# Patient Record
Sex: Female | Born: 1955 | Race: White | Hispanic: No | Marital: Married | Smoking: Never smoker
Health system: Southern US, Community
[De-identification: ages and names within clinical notes are randomized; demographics above are authoritative.]

## PROBLEM LIST (undated history)

## (undated) DIAGNOSIS — K219 Gastro-esophageal reflux disease without esophagitis: Secondary | ICD-10-CM

## (undated) DIAGNOSIS — E7800 Pure hypercholesterolemia, unspecified: Secondary | ICD-10-CM

## (undated) DIAGNOSIS — R7303 Prediabetes: Secondary | ICD-10-CM

## (undated) HISTORY — PX: ORIF ANKLE FRACTURE: SUR919

## (undated) HISTORY — PX: CARPAL TUNNEL RELEASE: SHX101

## (undated) HISTORY — PX: CHOLECYSTECTOMY: SHX55

---

## 2000-12-28 ENCOUNTER — Encounter: Payer: Self-pay | Admitting: Emergency Medicine

## 2000-12-28 ENCOUNTER — Emergency Department (HOSPITAL_COMMUNITY): Admission: EM | Admit: 2000-12-28 | Discharge: 2000-12-28 | Payer: Self-pay | Admitting: Emergency Medicine

## 2005-01-29 ENCOUNTER — Other Ambulatory Visit: Admission: RE | Admit: 2005-01-29 | Discharge: 2005-01-29 | Payer: Self-pay | Admitting: Family Medicine

## 2008-05-10 ENCOUNTER — Encounter: Admission: RE | Admit: 2008-05-10 | Discharge: 2008-05-10 | Payer: Self-pay | Admitting: Family Medicine

## 2012-07-07 ENCOUNTER — Other Ambulatory Visit (HOSPITAL_COMMUNITY)
Admission: RE | Admit: 2012-07-07 | Discharge: 2012-07-07 | Disposition: A | Discharge status: Home / Self Care | Payer: Self-pay | Source: Ambulatory Visit | Attending: Family Medicine | Admitting: Family Medicine

## 2012-07-07 ENCOUNTER — Other Ambulatory Visit: Payer: Self-pay | Admitting: Physician Assistant

## 2012-07-07 DIAGNOSIS — Z0000 Encounter for general adult medical examination without abnormal findings: Secondary | ICD-10-CM | POA: Insufficient documentation

## 2013-07-10 ENCOUNTER — Other Ambulatory Visit: Payer: Self-pay | Admitting: Family Medicine

## 2013-07-10 DIAGNOSIS — R921 Mammographic calcification found on diagnostic imaging of breast: Secondary | ICD-10-CM

## 2013-07-18 ENCOUNTER — Ambulatory Visit
Admission: RE | Admit: 2013-07-18 | Discharge: 2013-07-18 | Disposition: A | Discharge status: Home / Self Care | Payer: BC Managed Care – PPO | Source: Ambulatory Visit | Attending: Family Medicine | Admitting: Family Medicine

## 2013-07-18 ENCOUNTER — Encounter (INDEPENDENT_AMBULATORY_CARE_PROVIDER_SITE_OTHER): Payer: Self-pay

## 2013-07-18 ENCOUNTER — Other Ambulatory Visit: Payer: Self-pay | Admitting: Family Medicine

## 2013-07-18 DIAGNOSIS — R921 Mammographic calcification found on diagnostic imaging of breast: Secondary | ICD-10-CM

## 2013-09-10 ENCOUNTER — Other Ambulatory Visit (HOSPITAL_COMMUNITY): Payer: Self-pay | Admitting: Family Medicine

## 2013-09-10 DIAGNOSIS — R011 Cardiac murmur, unspecified: Secondary | ICD-10-CM

## 2013-09-12 ENCOUNTER — Ambulatory Visit (HOSPITAL_COMMUNITY)
Admission: RE | Admit: 2013-09-12 | Discharge: 2013-09-12 | Disposition: A | Discharge status: Home / Self Care | Payer: BC Managed Care – PPO | Source: Ambulatory Visit | Attending: Cardiovascular Disease | Admitting: Cardiovascular Disease

## 2013-09-12 DIAGNOSIS — R011 Cardiac murmur, unspecified: Secondary | ICD-10-CM | POA: Insufficient documentation

## 2013-09-12 DIAGNOSIS — I369 Nonrheumatic tricuspid valve disorder, unspecified: Secondary | ICD-10-CM

## 2013-09-12 NOTE — Progress Notes (Signed)
2D Echo Performed 09/12/2013    Jaquari Reckner, RCS  

## 2013-12-24 ENCOUNTER — Other Ambulatory Visit: Payer: Self-pay | Admitting: Family Medicine

## 2013-12-24 DIAGNOSIS — R921 Mammographic calcification found on diagnostic imaging of breast: Secondary | ICD-10-CM

## 2014-01-22 ENCOUNTER — Ambulatory Visit
Admission: RE | Admit: 2014-01-22 | Discharge: 2014-01-22 | Disposition: A | Discharge status: Home / Self Care | Payer: BC Managed Care – PPO | Source: Ambulatory Visit | Attending: Family Medicine | Admitting: Family Medicine

## 2014-01-22 DIAGNOSIS — R921 Mammographic calcification found on diagnostic imaging of breast: Secondary | ICD-10-CM

## 2014-05-20 ENCOUNTER — Other Ambulatory Visit: Payer: Self-pay | Admitting: Family Medicine

## 2014-05-20 DIAGNOSIS — R921 Mammographic calcification found on diagnostic imaging of breast: Secondary | ICD-10-CM

## 2014-06-07 ENCOUNTER — Ambulatory Visit
Admission: RE | Admit: 2014-06-07 | Discharge: 2014-06-07 | Disposition: A | Discharge status: Home / Self Care | Payer: BLUE CROSS/BLUE SHIELD | Source: Ambulatory Visit | Attending: Family Medicine | Admitting: Family Medicine

## 2014-06-07 DIAGNOSIS — R921 Mammographic calcification found on diagnostic imaging of breast: Secondary | ICD-10-CM

## 2015-06-13 ENCOUNTER — Other Ambulatory Visit: Payer: Self-pay | Admitting: Family Medicine

## 2015-06-13 DIAGNOSIS — R921 Mammographic calcification found on diagnostic imaging of breast: Secondary | ICD-10-CM

## 2015-06-14 DIAGNOSIS — Z0100 Encounter for examination of eyes and vision without abnormal findings: Secondary | ICD-10-CM | POA: Diagnosis not present

## 2015-06-14 DIAGNOSIS — H524 Presbyopia: Secondary | ICD-10-CM | POA: Diagnosis not present

## 2015-06-18 ENCOUNTER — Ambulatory Visit
Admission: RE | Admit: 2015-06-18 | Discharge: 2015-06-18 | Disposition: A | Discharge status: Home / Self Care | Payer: BLUE CROSS/BLUE SHIELD | Source: Ambulatory Visit | Attending: Family Medicine | Admitting: Family Medicine

## 2015-06-18 DIAGNOSIS — R921 Mammographic calcification found on diagnostic imaging of breast: Secondary | ICD-10-CM | POA: Diagnosis not present

## 2015-07-09 DIAGNOSIS — R42 Dizziness and giddiness: Secondary | ICD-10-CM | POA: Diagnosis not present

## 2015-12-03 ENCOUNTER — Other Ambulatory Visit (HOSPITAL_COMMUNITY)
Admission: RE | Admit: 2015-12-03 | Discharge: 2015-12-03 | Disposition: A | Discharge status: Home / Self Care | Payer: BLUE CROSS/BLUE SHIELD | Source: Ambulatory Visit | Attending: Family Medicine | Admitting: Family Medicine

## 2015-12-03 ENCOUNTER — Other Ambulatory Visit: Payer: Self-pay | Admitting: Family Medicine

## 2015-12-03 DIAGNOSIS — Z01419 Encounter for gynecological examination (general) (routine) without abnormal findings: Secondary | ICD-10-CM | POA: Insufficient documentation

## 2015-12-03 DIAGNOSIS — Z124 Encounter for screening for malignant neoplasm of cervix: Secondary | ICD-10-CM | POA: Diagnosis not present

## 2015-12-03 DIAGNOSIS — E119 Type 2 diabetes mellitus without complications: Secondary | ICD-10-CM | POA: Diagnosis not present

## 2015-12-03 DIAGNOSIS — Z0000 Encounter for general adult medical examination without abnormal findings: Secondary | ICD-10-CM | POA: Diagnosis not present

## 2015-12-03 DIAGNOSIS — Z6834 Body mass index (BMI) 34.0-34.9, adult: Secondary | ICD-10-CM | POA: Diagnosis not present

## 2015-12-03 DIAGNOSIS — E785 Hyperlipidemia, unspecified: Secondary | ICD-10-CM | POA: Diagnosis not present

## 2015-12-04 LAB — CYTOLOGY - PAP: DIAGNOSIS: NEGATIVE

## 2016-07-22 ENCOUNTER — Other Ambulatory Visit: Payer: Self-pay | Admitting: Family Medicine

## 2016-07-22 DIAGNOSIS — Z1231 Encounter for screening mammogram for malignant neoplasm of breast: Secondary | ICD-10-CM

## 2016-08-02 ENCOUNTER — Ambulatory Visit
Admission: RE | Admit: 2016-08-02 | Discharge: 2016-08-02 | Disposition: A | Discharge status: Home / Self Care | Payer: BLUE CROSS/BLUE SHIELD | Source: Ambulatory Visit | Attending: Family Medicine | Admitting: Family Medicine

## 2016-08-02 DIAGNOSIS — Z1231 Encounter for screening mammogram for malignant neoplasm of breast: Secondary | ICD-10-CM

## 2016-08-15 ENCOUNTER — Encounter (HOSPITAL_COMMUNITY): Payer: Self-pay | Admitting: *Deleted

## 2016-08-15 ENCOUNTER — Ambulatory Visit (HOSPITAL_COMMUNITY)
Admission: EM | Admit: 2016-08-15 | Discharge: 2016-08-15 | Disposition: A | Discharge status: Home / Self Care | Payer: BLUE CROSS/BLUE SHIELD | Attending: Internal Medicine | Admitting: Internal Medicine

## 2016-08-15 DIAGNOSIS — S39012A Strain of muscle, fascia and tendon of lower back, initial encounter: Secondary | ICD-10-CM | POA: Diagnosis not present

## 2016-08-15 HISTORY — DX: Pure hypercholesterolemia, unspecified: E78.00

## 2016-08-15 LAB — POCT URINALYSIS DIP (DEVICE)
Bilirubin Urine: NEGATIVE
Glucose, UA: NEGATIVE mg/dL
HGB URINE DIPSTICK: NEGATIVE
Ketones, ur: NEGATIVE mg/dL
Nitrite: NEGATIVE
Protein, ur: NEGATIVE mg/dL
SPECIFIC GRAVITY, URINE: 1.025 (ref 1.005–1.030)
UROBILINOGEN UA: 0.2 mg/dL (ref 0.0–1.0)
pH: 5.5 (ref 5.0–8.0)

## 2016-08-15 MED ORDER — CYCLOBENZAPRINE HCL 10 MG PO TABS: 10.0000 mg | ORAL_TABLET | Freq: Two times a day (BID) | ORAL | 0 refills | Status: DC | PRN

## 2016-08-15 NOTE — ED Triage Notes (Signed)
C/O low back pain since last week; c/o urinary urgency with some incontinence.  Has been taking AZO & cranberry juice.  Denies dysuria.

## 2016-12-13 DIAGNOSIS — E119 Type 2 diabetes mellitus without complications: Secondary | ICD-10-CM | POA: Diagnosis not present

## 2016-12-13 DIAGNOSIS — E785 Hyperlipidemia, unspecified: Secondary | ICD-10-CM | POA: Diagnosis not present

## 2016-12-13 DIAGNOSIS — Z23 Encounter for immunization: Secondary | ICD-10-CM | POA: Diagnosis not present

## 2016-12-13 DIAGNOSIS — Z0000 Encounter for general adult medical examination without abnormal findings: Secondary | ICD-10-CM | POA: Diagnosis not present

## 2017-01-12 DIAGNOSIS — R0989 Other specified symptoms and signs involving the circulatory and respiratory systems: Secondary | ICD-10-CM | POA: Diagnosis not present

## 2017-02-02 DIAGNOSIS — M722 Plantar fascial fibromatosis: Secondary | ICD-10-CM | POA: Diagnosis not present

## 2017-02-02 DIAGNOSIS — I10 Essential (primary) hypertension: Secondary | ICD-10-CM | POA: Diagnosis not present

## 2017-02-18 DIAGNOSIS — R509 Fever, unspecified: Secondary | ICD-10-CM | POA: Diagnosis not present

## 2017-02-18 DIAGNOSIS — J111 Influenza due to unidentified influenza virus with other respiratory manifestations: Secondary | ICD-10-CM | POA: Diagnosis not present

## 2017-06-14 DIAGNOSIS — E1169 Type 2 diabetes mellitus with other specified complication: Secondary | ICD-10-CM | POA: Diagnosis not present

## 2017-06-14 DIAGNOSIS — R030 Elevated blood-pressure reading, without diagnosis of hypertension: Secondary | ICD-10-CM | POA: Diagnosis not present

## 2017-06-14 DIAGNOSIS — E785 Hyperlipidemia, unspecified: Secondary | ICD-10-CM | POA: Diagnosis not present

## 2017-06-14 DIAGNOSIS — K219 Gastro-esophageal reflux disease without esophagitis: Secondary | ICD-10-CM | POA: Diagnosis not present

## 2017-09-08 ENCOUNTER — Other Ambulatory Visit: Payer: Self-pay | Admitting: Family Medicine

## 2017-09-08 DIAGNOSIS — Z1231 Encounter for screening mammogram for malignant neoplasm of breast: Secondary | ICD-10-CM

## 2017-09-16 DIAGNOSIS — M25552 Pain in left hip: Secondary | ICD-10-CM | POA: Diagnosis not present

## 2017-09-16 DIAGNOSIS — I10 Essential (primary) hypertension: Secondary | ICD-10-CM | POA: Diagnosis not present

## 2017-10-07 ENCOUNTER — Ambulatory Visit
Admission: RE | Admit: 2017-10-07 | Discharge: 2017-10-07 | Disposition: A | Discharge status: Home / Self Care | Payer: BLUE CROSS/BLUE SHIELD | Source: Ambulatory Visit | Attending: Family Medicine | Admitting: Family Medicine

## 2017-10-07 DIAGNOSIS — Z1231 Encounter for screening mammogram for malignant neoplasm of breast: Secondary | ICD-10-CM

## 2017-11-25 DIAGNOSIS — R399 Unspecified symptoms and signs involving the genitourinary system: Secondary | ICD-10-CM | POA: Diagnosis not present

## 2017-12-28 DIAGNOSIS — E119 Type 2 diabetes mellitus without complications: Secondary | ICD-10-CM | POA: Diagnosis not present

## 2017-12-28 DIAGNOSIS — E559 Vitamin D deficiency, unspecified: Secondary | ICD-10-CM | POA: Diagnosis not present

## 2017-12-28 DIAGNOSIS — E785 Hyperlipidemia, unspecified: Secondary | ICD-10-CM | POA: Diagnosis not present

## 2017-12-28 DIAGNOSIS — Z0000 Encounter for general adult medical examination without abnormal findings: Secondary | ICD-10-CM | POA: Diagnosis not present

## 2018-08-31 ENCOUNTER — Other Ambulatory Visit: Payer: Self-pay | Admitting: Family Medicine

## 2018-08-31 DIAGNOSIS — Z1231 Encounter for screening mammogram for malignant neoplasm of breast: Secondary | ICD-10-CM

## 2018-10-13 ENCOUNTER — Other Ambulatory Visit: Payer: Self-pay

## 2018-10-13 ENCOUNTER — Ambulatory Visit
Admission: RE | Admit: 2018-10-13 | Discharge: 2018-10-13 | Disposition: A | Discharge status: Home / Self Care | Payer: Medicare Other | Source: Ambulatory Visit | Attending: Family Medicine | Admitting: Family Medicine

## 2018-10-13 DIAGNOSIS — Z1231 Encounter for screening mammogram for malignant neoplasm of breast: Secondary | ICD-10-CM

## 2019-07-18 ENCOUNTER — Ambulatory Visit: Payer: Self-pay | Admitting: General Surgery

## 2019-07-18 NOTE — H&P (Signed)
[facility] Appointment: 07/18/2019 10:20 AM Location: [facility] Patient #: [id number] DOB: [birth year] Married / Language: [place] / Race: White Female   History of Present Illness Patricia Josephs E. Patricia Morn MD; 07/18/2019 10:36 AM) The patient is a 64 year old female who presents with an abdominal wall hernia. Dr. Merri Brunette asked me to see Patricia Beltran in consultation regarding a ventral hernia. Patricia Beltran works at a [employer] and felt some discomfort in her epigastric region. She noted a small lump there. Dr. Katrinka Blazing ordered an ultrasound for further evaluation and this showed a small fat-containing hernia. Patricia Beltran has had a laparoscopic cholecystectomy but no other abdominal surgeries in the past. She C/O localized pain which is exacerbated by activity including work. No change in bowel habits or bladder habits.   Allergies [facility], RMA; 07/18/2019 10:15 AM) No Known Drug Allergies  [07/18/2019]: Allergies Reconciled   Medication History [facility], RMA; 07/18/2019 10:15 AM) Simvastatin (40MG  Tablet, Oral) Active. Aspirin (81MG  Tablet DR, Oral) Active. Medications Reconciled  Vitals (Patricia Beltran RMA; 07/18/2019 10:16 AM) 07/18/2019 10:15 AM Weight: 250 lb Height: 61in Body Surface Area: 2.08 m Body Mass Index: 47.24 kg/m  Temp.: 97.9F (Temporal)  Pulse: 86 (Regular)  P.OX: 97% (Room air) BP: 140/88(Sitting, Left Wrist, Standard)       Physical Exam 07/20/2019 E. 07/20/2019 MD; 07/18/2019 10:38 AM) General Mental Status-Alert. General Appearance-Consistent with stated age. Hydration-Well hydrated. Voice-Normal.  Head and Neck Head-normocephalic, atraumatic with no lesions or palpable masses. Trachea-midline. Thyroid Gland Characteristics - normal size and consistency.  Eye Eyeball - Bilateral-Extraocular movements intact. Sclera/Conjunctiva - Bilateral-No scleral icterus.  Chest and Lung Exam Chest and  lung exam reveals -quiet, even and easy respiratory effort with no use of accessory muscles and on auscultation, normal breath sounds, no adventitious sounds and normal vocal resonance. Inspection Chest Wall - Normal. Back - normal.  Cardiovascular Cardiovascular examination reveals -normal heart sounds, regular rate and rhythm with no murmurs and normal pedal pulses bilaterally.  Abdomen Inspection Hernias - Ventral - Reducible. Note: Small ventral hernia about 8cm down from 20 her xiphoid, easily reduces but does spontaneously recur when she stands up, NT. Incisional scars - Note: lap chole scars very faint and hard to see with abdominal striae. Palpation/Percussion Palpation and Percussion of the abdomen reveal - Soft, Non Tender, No Rebound tenderness, No Rigidity (guarding) and No hepatosplenomegaly. Auscultation Auscultation of the abdomen reveals - Bowel sounds normal.  Neurologic Neurologic evaluation reveals -alert and oriented x 3 with no impairment of recent or remote memory. Mental Status-Normal.  Musculoskeletal Global Assessment -Note: no gross deformities.  Normal Exam - Left-Upper Extremity Strength Normal and Lower Extremity Strength Normal. Normal Exam - Right-Upper Extremity Strength Normal and Lower Extremity Strength Normal.  Lymphatic Head & Neck  General Head & Neck Lymphatics: Bilateral - Description - Normal. Axillary  General Axillary Region: Bilateral - Description - Normal. Tenderness - Non Tender. Femoral & Inguinal  Generalized Femoral & Inguinal Lymphatics: Bilateral - Description - No Generalized lymphadenopathy.    Assessment & Plan Patricia Beltran E. 07/20/2019 MD; 07/18/2019 10:40 AM) VENTRAL HERNIA WITHOUT OBSTRUCTION OR GANGRENE (K43.9) Impression: Hernia defect feels small, probably 2 cm. I have offered ventral hernia repair with mesh as an outpatient surgical procedure. I discussed the procedure, risks, benefits, and expected  postoperative course. She will not be able to lift anything heavy and will need to be off work for 6 weeks postoperatively. She does have short-term disability from her employer. I also discussed  the ERAS protocol and answered her questions. She is agreeable.  Violeta Gelinas, MD, MPH, FACS Please use [email or web address] to contact on call provider

## 2019-08-16 NOTE — Pre-Procedure Instructions (Signed)
Your procedure is scheduled on Tuesday, August 3rd, from 09:00 AM- 10:00 AM.  Report to [facility] Main Entrance "A" at 07:00 A.M., and check in at the Admitting office.  Call this number if you have problems the morning of surgery:  [phone]  Call [phone] if you have any questions prior to your surgery date Monday-Friday 8am-4pm.    Remember:  Do not eat after midnight the night before your surgery.  You may drink clear liquids until 06:00 AM the morning of your surgery.   Clear liquids allowed are: Water, Non-Citrus Juices (without pulp), Carbonated Beverages, Clear Tea, Black Coffee Only, and Gatorade. .  . The day of surgery (if you do NOT have diabetes):  o Drink ONE (1) Pre-Surgery Clear Ensure by 06:00 AM the morning of surgery.   o This drink was given to you during your hospital  pre-op appointment visit. o Nothing else to drink after completing the  Pre-Surgery Clear Ensure.         If you have questions, please contact your surgeon's office.     Take these medicines the morning of surgery with A SIP OF WATER: simvastatin (ZOCOR)  As of today, STOP taking any Aspirin (unless otherwise instructed by your surgeon) Aleve, Naproxen, Ibuprofen, Motrin, Advil, Goody's, BC's, all herbal medications, fish oil, and all vitamins.         The Morning of Surgery:             Do not wear jewelry, make up, or nail polish.            Do not wear lotions, powders, perfumes, or deodorant.            Do not shave 48 hours prior to surgery.              Do not bring valuables to the hospital.            [facility] is not responsible for any belongings or valuables.  Do NOT Smoke (Tobacco/Vaping) or drink Alcohol 24 hours prior to your procedure. If you use a CPAP at night, you may bring all equipment for your overnight stay.   Contacts, glasses, dentures or bridgework may not be worn into surgery.      For patients admitted to the hospital, discharge time will be  determined by your treatment team.   Patients discharged the day of surgery will not be allowed to drive home, and someone needs to stay with them for 24 hours.    Special instructions:   [place]- Preparing For Surgery  Before surgery, you can play an important role. Because skin is not sterile, your skin needs to be as free of germs as possible. You can reduce the number of germs on your skin by washing with CHG (chlorahexidine gluconate) Soap before surgery.  CHG is an antiseptic cleaner which kills germs and bonds with the skin to continue killing germs even after washing.    Oral Hygiene is also important to reduce your risk of infection.  Remember - BRUSH YOUR TEETH THE MORNING OF SURGERY WITH YOUR REGULAR TOOTHPASTE  Please do not use if you have an allergy to CHG or antibacterial soaps. If your skin becomes reddened/irritated stop using the CHG.  Do not shave (including legs and underarms) for at least 48 hours prior to first CHG shower. It is OK to shave your face.  Please follow these instructions carefully.   1. Shower the NIGHT BEFORE SURGERY and  the MORNING OF SURGERY with CHG Soap.   2. If you chose to wash your hair, wash your hair first as usual with your normal shampoo.  3. After you shampoo, rinse your hair and body thoroughly to remove the shampoo.  4. Use CHG as you would any other liquid soap. You can apply CHG directly to the skin and wash gently with a scrungie or a clean washcloth.   5. Apply the CHG Soap to your body ONLY FROM THE NECK DOWN.  Do not use on open wounds or open sores. Avoid contact with your eyes, ears, mouth and genitals (private parts). Wash Face and genitals (private parts)  with your normal soap.   6. Wash thoroughly, paying special attention to the area where your surgery will be performed.  7. Thoroughly rinse your body with warm water from the neck down.  8. DO NOT shower/wash with your normal soap after using and rinsing off the CHG  Soap.  9. Pat yourself dry with a CLEAN TOWEL.  10. Wear CLEAN PAJAMAS to bed the night before surgery  11. Place CLEAN SHEETS on your bed the night of your first shower and DO NOT SLEEP WITH PETS.   Day of Surgery: Wear Clean/Comfortable clothing the morning of surgery Do not apply any deodorants/lotions.   Remember to brush your teeth WITH YOUR REGULAR TOOTHPASTE.   Please read over the following fact sheets that you were given.

## 2019-08-17 ENCOUNTER — Encounter (HOSPITAL_COMMUNITY): Payer: Self-pay

## 2019-08-17 ENCOUNTER — Other Ambulatory Visit: Payer: Self-pay

## 2019-08-17 ENCOUNTER — Other Ambulatory Visit (HOSPITAL_COMMUNITY)
Admission: RE | Admit: 2019-08-17 | Discharge: 2019-08-17 | Disposition: A | Discharge status: Home / Self Care | Payer: Medicare Other | Source: Ambulatory Visit | Attending: General Surgery | Admitting: General Surgery

## 2019-08-17 ENCOUNTER — Encounter (HOSPITAL_COMMUNITY)
Admission: RE | Admit: 2019-08-17 | Discharge: 2019-08-17 | Disposition: A | Discharge status: Home / Self Care | Payer: Medicare Other | Source: Ambulatory Visit | Attending: General Surgery | Admitting: General Surgery

## 2019-08-17 DIAGNOSIS — Z01812 Encounter for preprocedural laboratory examination: Secondary | ICD-10-CM | POA: Insufficient documentation

## 2019-08-17 DIAGNOSIS — Z20822 Contact with and (suspected) exposure to covid-19: Secondary | ICD-10-CM | POA: Insufficient documentation

## 2019-08-17 HISTORY — DX: Gastro-esophageal reflux disease without esophagitis: K21.9

## 2019-08-17 HISTORY — DX: Prediabetes: R73.03

## 2019-08-17 LAB — CBC
HCT: 42.3 % (ref 36.0–46.0)
Hemoglobin: 13.5 g/dL (ref 12.0–15.0)
MCH: 29.3 pg (ref 26.0–34.0)
MCHC: 31.9 g/dL (ref 30.0–36.0)
MCV: 91.8 fL (ref 80.0–100.0)
Platelets: 266 10*3/uL (ref 150–400)
RBC: 4.61 MIL/uL (ref 3.87–5.11)
RDW: 12.4 % (ref 11.5–15.5)
WBC: 5.9 10*3/uL (ref 4.0–10.5)
nRBC: 0 % (ref 0.0–0.2)

## 2019-08-17 LAB — HEMOGLOBIN A1C
Hgb A1c MFr Bld: 6.3 % — ABNORMAL HIGH (ref 4.8–5.6)
Mean Plasma Glucose: 134.11 mg/dL

## 2019-08-17 LAB — SARS CORONAVIRUS 2 (TAT 6-24 HRS): SARS Coronavirus 2: NEGATIVE

## 2019-08-17 NOTE — Progress Notes (Signed)
PCP - Merri Brunette, MD Cardiologist - Denies  PPM/ICD - Denies  Chest x-ray - N/A EKG - N/A Stress Test - Denies ECHO - Denies Cardiac Cath - Denies  Sleep Study - Denies  Patient states she was told she was pre-diabetic years ago. Does not check CBG. A1C  obtained.  Blood Thinner Instructions: N/A Aspirin Instructions: N/A  ERAS Protcol - Yes PRE-SURGERY Ensure or G2- Ensure given  COVID TEST- 08/17/19   Anesthesia review: No  Patient denies shortness of breath, fever, cough and chest pain at PAT appointment   All instructions explained to the patient, with a verbal understanding of the material. Patient agrees to go over the instructions while at home for a better understanding. Patient also instructed to self quarantine after being tested for COVID-19. The opportunity to ask questions was provided.

## 2019-08-21 ENCOUNTER — Ambulatory Visit (HOSPITAL_COMMUNITY): Payer: Medicare Other

## 2019-08-21 ENCOUNTER — Encounter (HOSPITAL_COMMUNITY): Payer: Self-pay | Admitting: General Surgery

## 2019-08-21 ENCOUNTER — Encounter (HOSPITAL_COMMUNITY)
Admission: RE | Disposition: A | Discharge status: Home / Self Care | Payer: Self-pay | Source: Home / Self Care | Attending: General Surgery

## 2019-08-21 ENCOUNTER — Ambulatory Visit (HOSPITAL_COMMUNITY)
Admission: RE | Admit: 2019-08-21 | Discharge: 2019-08-21 | Disposition: A | Discharge status: Home / Self Care | Payer: Medicare Other | Attending: General Surgery | Admitting: General Surgery

## 2019-08-21 ENCOUNTER — Other Ambulatory Visit: Payer: Self-pay

## 2019-08-21 DIAGNOSIS — K439 Ventral hernia without obstruction or gangrene: Secondary | ICD-10-CM | POA: Diagnosis present

## 2019-08-21 DIAGNOSIS — E7800 Pure hypercholesterolemia, unspecified: Secondary | ICD-10-CM | POA: Diagnosis not present

## 2019-08-21 DIAGNOSIS — Z9049 Acquired absence of other specified parts of digestive tract: Secondary | ICD-10-CM | POA: Diagnosis not present

## 2019-08-21 DIAGNOSIS — Z7982 Long term (current) use of aspirin: Secondary | ICD-10-CM | POA: Diagnosis not present

## 2019-08-21 DIAGNOSIS — Z79899 Other long term (current) drug therapy: Secondary | ICD-10-CM | POA: Diagnosis not present

## 2019-08-21 HISTORY — PX: VENTRAL HERNIA REPAIR: SHX424

## 2019-08-21 LAB — GLUCOSE, CAPILLARY
Glucose-Capillary: 194 mg/dL — ABNORMAL HIGH (ref 70–99)
Glucose-Capillary: 194 mg/dL — ABNORMAL HIGH (ref 70–99)
Glucose-Capillary: 112 mg/dL — ABNORMAL HIGH (ref 70–99)

## 2019-08-21 SURGERY — REPAIR, HERNIA, VENTRAL
Anesthesia: General | Site: Abdomen

## 2019-08-21 MED ORDER — BUPIVACAINE-EPINEPHRINE (PF) 0.25% -1:200000 IJ SOLN
INTRAMUSCULAR | Status: AC
  Filled 2019-08-21: qty 30

## 2019-08-21 MED ORDER — PROPOFOL 10 MG/ML IV BOLUS
INTRAVENOUS | Status: DC | PRN
  Administered 2019-08-21: 140 mg via INTRAVENOUS

## 2019-08-21 MED ORDER — STERILE WATER FOR IRRIGATION IR SOLN
Status: DC | PRN
  Administered 2019-08-21: 1000 mL

## 2019-08-21 MED ORDER — ORAL CARE MOUTH RINSE: 15.0000 mL | Freq: Once | OROMUCOSAL | Status: AC

## 2019-08-21 MED ORDER — DEXAMETHASONE SODIUM PHOSPHATE 10 MG/ML IJ SOLN
INTRAMUSCULAR | Status: AC
  Filled 2019-08-21: qty 1

## 2019-08-21 MED ORDER — OXYCODONE HCL 5 MG PO TABS
5.0000 mg | ORAL_TABLET | Freq: Four times a day (QID) | ORAL | 0 refills | Status: AC | PRN
Start: 2019-08-21 — End: ?

## 2019-08-21 MED ORDER — ROCURONIUM BROMIDE 10 MG/ML (PF) SYRINGE
PREFILLED_SYRINGE | INTRAVENOUS | Status: AC
  Filled 2019-08-21: qty 10

## 2019-08-21 MED ORDER — LIDOCAINE 2% (20 MG/ML) 5 ML SYRINGE
INTRAMUSCULAR | Status: AC
  Filled 2019-08-21: qty 5

## 2019-08-21 MED ORDER — PHENYLEPHRINE 40 MCG/ML (10ML) SYRINGE FOR IV PUSH (FOR BLOOD PRESSURE SUPPORT)
PREFILLED_SYRINGE | INTRAVENOUS | Status: AC
  Filled 2019-08-21: qty 20

## 2019-08-21 MED ORDER — CHLORHEXIDINE GLUCONATE 0.12 % MT SOLN
15.0000 mL | Freq: Once | OROMUCOSAL | Status: AC
  Administered 2019-08-21: 15 mL via OROMUCOSAL
  Filled 2019-08-21: qty 15

## 2019-08-21 MED ORDER — SUGAMMADEX SODIUM 200 MG/2ML IV SOLN
INTRAVENOUS | Status: DC | PRN
Start: 2019-08-21 — End: 2019-08-21
  Administered 2019-08-21: 400 mg via INTRAVENOUS

## 2019-08-21 MED ORDER — MIDAZOLAM HCL 2 MG/2ML IJ SOLN
INTRAMUSCULAR | Status: AC
  Filled 2019-08-21: qty 2

## 2019-08-21 MED ORDER — BUPIVACAINE-EPINEPHRINE 0.25% -1:200000 IJ SOLN
INTRAMUSCULAR | Status: DC | PRN
  Administered 2019-08-21: 17 mL

## 2019-08-21 MED ORDER — ONDANSETRON HCL 4 MG/2ML IJ SOLN: 4.0000 mg | Freq: Once | INTRAMUSCULAR | Status: DC | PRN

## 2019-08-21 MED ORDER — FENTANYL CITRATE (PF) 100 MCG/2ML IJ SOLN: 25.0000 ug | INTRAMUSCULAR | Status: DC | PRN

## 2019-08-21 MED ORDER — CHLORHEXIDINE GLUCONATE CLOTH 2 % EX PADS: 6.0000 | MEDICATED_PAD | Freq: Once | CUTANEOUS | Status: DC

## 2019-08-21 MED ORDER — ROCURONIUM BROMIDE 100 MG/10ML IV SOLN
INTRAVENOUS | Status: DC | PRN
  Administered 2019-08-21: 60 mg via INTRAVENOUS

## 2019-08-21 MED ORDER — MIDAZOLAM HCL 5 MG/5ML IJ SOLN
INTRAMUSCULAR | Status: DC | PRN
  Administered 2019-08-21: 2 mg via INTRAVENOUS

## 2019-08-21 MED ORDER — PHENYLEPHRINE HCL (PRESSORS) 10 MG/ML IV SOLN
INTRAVENOUS | Status: DC | PRN
  Administered 2019-08-21 (×2): 80 ug via INTRAVENOUS
  Administered 2019-08-21: 120 ug via INTRAVENOUS

## 2019-08-21 MED ORDER — LACTATED RINGERS IV SOLN: INTRAVENOUS | Status: DC

## 2019-08-21 MED ORDER — ENSURE PRE-SURGERY PO LIQD: 296.0000 mL | Freq: Once | ORAL | Status: DC

## 2019-08-21 MED ORDER — ONDANSETRON HCL 4 MG/2ML IJ SOLN
INTRAMUSCULAR | Status: AC
  Filled 2019-08-21: qty 2

## 2019-08-21 MED ORDER — FENTANYL CITRATE (PF) 250 MCG/5ML IJ SOLN
INTRAMUSCULAR | Status: DC | PRN
  Administered 2019-08-21: 75 ug via INTRAVENOUS

## 2019-08-21 MED ORDER — OXYCODONE HCL 5 MG/5ML PO SOLN: 5.0000 mg | Freq: Once | ORAL | Status: DC | PRN

## 2019-08-21 MED ORDER — FENTANYL CITRATE (PF) 250 MCG/5ML IJ SOLN
INTRAMUSCULAR | Status: AC
  Filled 2019-08-21: qty 5

## 2019-08-21 MED ORDER — CEFAZOLIN SODIUM-DEXTROSE 2-4 GM/100ML-% IV SOLN
2.0000 g | INTRAVENOUS | Status: AC
  Administered 2019-08-21: 2 g via INTRAVENOUS
  Filled 2019-08-21: qty 100

## 2019-08-21 MED ORDER — ACETAMINOPHEN 500 MG PO TABS
1000.0000 mg | ORAL_TABLET | ORAL | Status: AC
  Administered 2019-08-21: 1000 mg via ORAL
  Filled 2019-08-21: qty 2

## 2019-08-21 MED ORDER — DEXAMETHASONE SODIUM PHOSPHATE 10 MG/ML IJ SOLN
INTRAMUSCULAR | Status: DC | PRN
Start: 2019-08-21 — End: 2019-08-21
  Administered 2019-08-21: 5 mg via INTRAVENOUS

## 2019-08-21 MED ORDER — GABAPENTIN 300 MG PO CAPS
300.0000 mg | ORAL_CAPSULE | ORAL | Status: AC
  Administered 2019-08-21: 300 mg via ORAL
  Filled 2019-08-21: qty 1

## 2019-08-21 MED ORDER — ONDANSETRON HCL 4 MG/2ML IJ SOLN
INTRAMUSCULAR | Status: DC | PRN
  Administered 2019-08-21: 4 mg via INTRAVENOUS

## 2019-08-21 MED ORDER — OXYCODONE HCL 5 MG PO TABS: 5.0000 mg | ORAL_TABLET | Freq: Once | ORAL | Status: DC | PRN

## 2019-08-21 MED ORDER — 0.9 % SODIUM CHLORIDE (POUR BTL) OPTIME
TOPICAL | Status: DC | PRN
  Administered 2019-08-21: 1000 mL

## 2019-08-21 MED ORDER — LIDOCAINE 2% (20 MG/ML) 5 ML SYRINGE
INTRAMUSCULAR | Status: DC | PRN
  Administered 2019-08-21: 60 mg via INTRAVENOUS

## 2019-08-21 SURGICAL SUPPLY — 41 items
ADH SKN CLS APL DERMABOND .7 (GAUZE/BANDAGES/DRESSINGS) ×2
APL PRP STRL LF DISP 70% ISPRP (MISCELLANEOUS) ×1
BLADE CLIPPER SURG (BLADE) IMPLANT
CANISTER SUCT 3000ML PPV (MISCELLANEOUS) ×2 IMPLANT
CHLORAPREP W/TINT 26 (MISCELLANEOUS) ×2 IMPLANT
COVER SURGICAL LIGHT HANDLE (MISCELLANEOUS) ×2 IMPLANT
COVER WAND RF STERILE (DRAPES) ×2 IMPLANT
DERMABOND ADVANCED (GAUZE/BANDAGES/DRESSINGS) ×2
DERMABOND ADVANCED .7 DNX12 (GAUZE/BANDAGES/DRESSINGS) IMPLANT
DRAPE LAPAROSCOPIC ABDOMINAL (DRAPES) ×2 IMPLANT
ELECT BLADE 4.0 EZ CLEAN MEGAD (MISCELLANEOUS) ×2
ELECT CAUTERY BLADE 6.4 (BLADE) ×2 IMPLANT
ELECT REM PT RETURN 9FT ADLT (ELECTROSURGICAL) ×2
ELECTRODE BLDE 4.0 EZ CLN MEGD (MISCELLANEOUS) IMPLANT
ELECTRODE REM PT RTRN 9FT ADLT (ELECTROSURGICAL) ×1 IMPLANT
GAUZE SPONGE 4X4 12PLY STRL (GAUZE/BANDAGES/DRESSINGS) IMPLANT
GLOVE BIO SURGEON STRL SZ8 (GLOVE) ×2 IMPLANT
GLOVE BIOGEL PI IND STRL 8 (GLOVE) ×1 IMPLANT
GLOVE BIOGEL PI INDICATOR 8 (GLOVE) ×1
GOWN STRL REUS W/ TWL LRG LVL3 (GOWN DISPOSABLE) ×2 IMPLANT
GOWN STRL REUS W/ TWL XL LVL3 (GOWN DISPOSABLE) ×1 IMPLANT
GOWN STRL REUS W/TWL LRG LVL3 (GOWN DISPOSABLE) ×4
GOWN STRL REUS W/TWL XL LVL3 (GOWN DISPOSABLE) ×2
KIT BASIN OR (CUSTOM PROCEDURE TRAY) ×2 IMPLANT
KIT TURNOVER KIT B (KITS) ×2 IMPLANT
MESH VENTRALEX ST 1-7/10 CRC S (Mesh General) ×1 IMPLANT
NEEDLE 22X1 1/2 (OR ONLY) (NEEDLE) ×2 IMPLANT
NS IRRIG 1000ML POUR BTL (IV SOLUTION) ×2 IMPLANT
PACK GENERAL/GYN (CUSTOM PROCEDURE TRAY) ×2 IMPLANT
PAD ARMBOARD 7.5X6 YLW CONV (MISCELLANEOUS) ×4 IMPLANT
PENCIL SMOKE EVACUATOR (MISCELLANEOUS) ×2 IMPLANT
STAPLER VISISTAT 35W (STAPLE) ×1 IMPLANT
SUT MNCRL AB 4-0 PS2 18 (SUTURE) ×2 IMPLANT
SUT PROLENE 0 CT 2 (SUTURE) ×2 IMPLANT
SUT VIC AB 3-0 SH 27 (SUTURE) ×2
SUT VIC AB 3-0 SH 27X BRD (SUTURE) ×1 IMPLANT
SYR BULB EAR ULCER 3OZ GRN STR (SYRINGE) ×1 IMPLANT
SYR CONTROL 10ML LL (SYRINGE) ×2 IMPLANT
TOWEL GREEN STERILE (TOWEL DISPOSABLE) ×2 IMPLANT
TOWEL GREEN STERILE FF (TOWEL DISPOSABLE) ×2 IMPLANT
TRAY FOLEY MTR SLVR 14FR STAT (SET/KITS/TRAYS/PACK) IMPLANT

## 2019-08-21 NOTE — Transfer of Care (Signed)
Immediate Anesthesia Transfer of Care Note  Patient: Patricia Beltran  Procedure(s) Performed: REPAIR OF VENTRAL HERNIA WITH MESH (N/A Abdomen)  Patient Location: PACU  Anesthesia Type:General  Level of Consciousness: drowsy and patient cooperative  Airway & Oxygen Therapy: Patient Spontanous Breathing and Patient connected to face mask  Post-op Assessment: Report given to RN, Post -op Vital signs reviewed and stable and Patient moving all extremities  Post vital signs: Reviewed and stable  Last Vitals:  Vitals Value Taken Time  BP 157/79    Temp    Pulse 70 08/21/19 1008  Resp 23 08/21/19 1008  SpO2 100 % 08/21/19 1008  Vitals shown include unvalidated device data.  Last Pain:  Vitals:   08/21/19 0717  TempSrc: Oral         Complications: No complications documented.

## 2019-08-21 NOTE — Anesthesia Procedure Notes (Signed)
Procedure Name: Intubation Date/Time: 08/21/2019 9:12 AM Performed by: Candis Shine, CRNA Pre-anesthesia Checklist: Patient identified, Emergency Drugs available, Suction available and Patient being monitored Patient Re-evaluated:Patient Re-evaluated prior to induction Oxygen Delivery Method: Circle System Utilized Preoxygenation: Pre-oxygenation with 100% oxygen Induction Type: IV induction Ventilation: Mask ventilation without difficulty and Oral airway inserted - appropriate to patient size Laryngoscope Size: Mac and 3 Grade View: Grade I Tube type: Oral Tube size: 7.0 mm Number of attempts: 1 Airway Equipment and Method: Stylet Placement Confirmation: ETT inserted through vocal cords under direct vision,  positive ETCO2 and breath sounds checked- equal and bilateral Secured at: 21 cm Tube secured with: Tape Dental Injury: Teeth and Oropharynx as per pre-operative assessment

## 2019-08-21 NOTE — Op Note (Signed)
  08/21/2019  9:58 AM  PATIENT:  Patricia Beltran  64 y.o. female  PRE-OPERATIVE DIAGNOSIS:  ventral hernia  POST-OPERATIVE DIAGNOSIS:  ventral hernia  PROCEDURE:  Procedure(s): REPAIR OF VENTRAL HERNIA WITH MESH 4.3CM VENTRALEX   SURGEON:  Surgeon(s): Violeta Gelinas, MD  ASSISTANTS: none   ANESTHESIA:   local and general  EBL:  No intake/output data recorded.  BLOOD ADMINISTERED:none  DRAINS: none   SPECIMEN:  No Specimen  DISPOSITION OF SPECIMEN:  N/A  COUNTS:  YES  DICTATION: .Dragon Dictation Findings: Ventral hernia containing fat, fascial defect 1.5 cm  Procedure in detail: Informed consent was obtained.  She received intravenous antibiotics.  She was brought to the operating room and general endotracheal anesthesia was administered by the anesthesia staff.  Her abdomen was prepped and draped in a sterile fashion.  We did a timeout procedure.  I injected local over the area of the hernia.  A midline incision was made.  Subcutaneous tissues were dissected down to the level of the fascia and identified the hernia.  There was a 3 cm ball of fatty tissue consistent with preperitoneal fat coming out through the hernia.  This was excised.  The hernia defect was about 1.5 cm in the fascia.  I cleared the fascia circumferentially inside and outside.  I then chose a 4.3 cm Ventralex mesh to complete the repair.  This was inserted and laid out flat nicely as an inlay mesh.  This was secured with multiple interrupted 0 Prolene sutures.  I injected some further local.  Hemostasis was ensured.  The wound was irrigated and then closed.  Subcutaneous tissues were closed with interrupted 3-0 Vicryl and the skin was closed with running 4-0 Monocryl subcuticular followed by Dermabond.  All counts were correct.  She tolerated the procedure well without apparent complication and was taken recovery in stable condition. PATIENT DISPOSITION:  PACU - hemodynamically stable.   Delay start of  Pharmacological VTE agent (>24hrs) due to surgical blood loss or risk of bleeding:  no  Violeta Gelinas, MD, MPH, FACS Pager: [phone]  8/3/20219:58 AM

## 2019-08-21 NOTE — Interval H&P Note (Signed)
History and Physical Interval Note:  08/21/2019 8:34 AM  Patricia Beltran  has presented today for surgery, with the diagnosis of ventral hernia.  The various methods of treatment have been discussed with the patient and family. After consideration of risks, benefits and other options for treatment, the patient has consented to  Procedure(s): REPAIR OF VENTRAL HERNIA WITH MESH (N/A) as a surgical intervention.  The patient's history has been reviewed, patient examined, no change in status, stable for surgery.  I have reviewed the patient's chart and labs.  Questions were answered to the patient's satisfaction.     Liz Malady

## 2019-08-21 NOTE — H&P (Signed)
Patricia Beltran is an 64 y.o. female.   Chief Complaint: ventral hernia HPI: Presents for repair of ventral hernia with mesh.  No changes since I saw her in the office.  Past Medical History:  Diagnosis Date  . GERD (gastroesophageal reflux disease)   . Hypercholesteremia   . Pre-diabetes     Past Surgical History:  Procedure Laterality Date  . CARPAL TUNNEL RELEASE    . CHOLECYSTECTOMY    . ORIF ANKLE FRACTURE      No family history on file. Social History:  reports that she has never smoked. She has never used smokeless tobacco. She reports that she does not drink alcohol and does not use drugs.  Allergies: No Known Allergies  Medications Prior to Admission  Medication Sig Dispense Refill  . aspirin 81 MG chewable tablet Chew 81 mg by mouth daily.     . simvastatin (ZOCOR) 40 MG tablet Take 40 mg by mouth daily.      Results for orders placed or performed during the hospital encounter of 08/21/19 (from the past 48 hour(s))  Glucose, capillary     Status: Abnormal   Collection Time: 08/21/19  7:16 AM  Result Value Ref Range   Glucose-Capillary 194 (H) 70 - 99 mg/dL    Comment: Glucose reference range applies only to samples taken after fasting for at least 8 hours.   No results found.  Review of Systems  Blood pressure (!) 180/87, pulse 83, temperature 98.1 F (36.7 C), temperature source Oral, resp. rate 18, height 5\' 1"  (1.549 m), weight 112.5 kg, SpO2 98 %. Physical Exam Constitutional:      Appearance: Normal appearance.  HENT:     Head: Normocephalic.     Nose: Nose normal.     Mouth/Throat:     Mouth: Mucous membranes are moist.     Pharynx: Oropharynx is clear.  Eyes:     Pupils: Pupils are equal, round, and reactive to light.  Cardiovascular:     Rate and Rhythm: Normal rate and regular rhythm.     Pulses: Normal pulses.     Heart sounds: Normal heart sounds.  Pulmonary:     Effort: Pulmonary effort is normal.     Breath sounds: Normal breath sounds.   Abdominal:     General: Abdomen is flat.     Palpations: Abdomen is soft.     Hernia: A hernia is present.     Comments: Ventral hernia along the upper midline about 8 cm caudal to the xiphoid, reduces  Musculoskeletal:        General: Normal range of motion.     Cervical back: Neck supple.  Skin:    General: Skin is warm and dry.  Neurological:     Mental Status: She is alert and oriented to person, place, and time.  Psychiatric:        Mood and Affect: Mood normal.      Assessment/Plan Ventral hernia -we will proceed with repair of ventral hernia with mesh.  I again discussed the procedure, risks, and benefits.  I discussed the expected postoperative course.  She is agreeable.  , MD 08/21/2019, 8:32 AM

## 2019-08-21 NOTE — Anesthesia Preprocedure Evaluation (Signed)
Anesthesia Evaluation  Patient identified by MRN, date of birth, ID band Patient awake    Reviewed: Allergy & Precautions, NPO status , Patient's Chart, lab work & pertinent test results  Airway Mallampati: II  TM Distance: >3 FB Neck ROM: Full    Dental  (+) Edentulous Upper, Partial Lower   Pulmonary    breath sounds clear to auscultation       Cardiovascular  Rhythm:Regular Rate:Normal     Neuro/Psych    GI/Hepatic   Endo/Other    Renal/GU      Musculoskeletal   Abdominal (+) + obese,   Peds  Hematology   Anesthesia Other Findings   Reproductive/Obstetrics                             Anesthesia Physical Anesthesia Plan  ASA: III  Anesthesia Plan: General   Post-op Pain Management:    Induction: Intravenous  PONV Risk Score and Plan: Ondansetron and Dexamethasone  Airway Management Planned: Oral ETT  Additional Equipment:   Intra-op Plan:   Post-operative Plan: Extubation in OR  Informed Consent: I have reviewed the patients History and Physical, chart, labs and discussed the procedure including the risks, benefits and alternatives for the proposed anesthesia with the patient or authorized representative who has indicated his/her understanding and acceptance.     Dental advisory given  Plan Discussed with: CRNA and Anesthesiologist  Anesthesia Plan Comments: (Obesity Pre-diabetes Ventral hernia  Plan GA with oral ETT  Kipp Brood)        Anesthesia Quick Evaluation

## 2019-08-21 NOTE — Anesthesia Postprocedure Evaluation (Signed)
Anesthesia Post Note  Patient: Patricia Beltran  Procedure(s) Performed: REPAIR OF VENTRAL HERNIA WITH MESH (N/A Abdomen)     Patient location during evaluation: PACU Anesthesia Type: General Level of consciousness: awake and alert Pain management: pain level controlled Vital Signs Assessment: post-procedure vital signs reviewed and stable Respiratory status: spontaneous breathing, nonlabored ventilation, respiratory function stable and patient connected to nasal cannula oxygen Cardiovascular status: blood pressure returned to baseline and stable Postop Assessment: no apparent nausea or vomiting Anesthetic complications: no   No complications documented.  Last Vitals:  Vitals:   08/21/19 1023 08/21/19 1038  BP: (!) 141/79 136/83  Pulse: 61 66  Resp: 18 13  Temp:  36.6 C  SpO2: 100% 100%    Last Pain:  Vitals:   08/21/19 1038  TempSrc:   PainSc: 5                  Heylee Tant COKER

## 2019-08-22 ENCOUNTER — Encounter (HOSPITAL_COMMUNITY): Payer: Self-pay | Admitting: General Surgery

## 2019-09-20 ENCOUNTER — Other Ambulatory Visit: Payer: Self-pay | Admitting: Family Medicine

## 2019-09-20 DIAGNOSIS — Z1231 Encounter for screening mammogram for malignant neoplasm of breast: Secondary | ICD-10-CM

## 2019-10-19 ENCOUNTER — Ambulatory Visit
Admission: RE | Admit: 2019-10-19 | Discharge: 2019-10-19 | Disposition: A | Discharge status: Home / Self Care | Payer: Medicare Other | Source: Ambulatory Visit | Attending: Family Medicine | Admitting: Family Medicine

## 2019-10-19 ENCOUNTER — Other Ambulatory Visit: Payer: Self-pay

## 2019-10-19 DIAGNOSIS — Z1231 Encounter for screening mammogram for malignant neoplasm of breast: Secondary | ICD-10-CM

## 2019-11-02 ENCOUNTER — Observation Stay (HOSPITAL_COMMUNITY)
Admission: EM | Admit: 2019-11-02 | Discharge: 2019-11-04 | Disposition: A | Discharge status: Home / Self Care | Payer: Medicare Other | Attending: Student | Admitting: Student

## 2019-11-02 ENCOUNTER — Encounter (HOSPITAL_COMMUNITY): Payer: Self-pay | Admitting: *Deleted

## 2019-11-02 ENCOUNTER — Other Ambulatory Visit: Payer: Self-pay

## 2019-11-02 ENCOUNTER — Emergency Department (HOSPITAL_COMMUNITY): Payer: Medicare Other

## 2019-11-02 DIAGNOSIS — T1490XA Injury, unspecified, initial encounter: Secondary | ICD-10-CM

## 2019-11-02 DIAGNOSIS — R42 Dizziness and giddiness: Secondary | ICD-10-CM

## 2019-11-02 DIAGNOSIS — S0191XA Laceration without foreign body of unspecified part of head, initial encounter: Secondary | ICD-10-CM

## 2019-11-02 DIAGNOSIS — M25569 Pain in unspecified knee: Secondary | ICD-10-CM

## 2019-11-02 DIAGNOSIS — S060X1A Concussion with loss of consciousness of 30 minutes or less, initial encounter: Secondary | ICD-10-CM

## 2019-11-02 DIAGNOSIS — S0101XA Laceration without foreign body of scalp, initial encounter: Secondary | ICD-10-CM

## 2019-11-02 DIAGNOSIS — S060X9A Concussion with loss of consciousness of unspecified duration, initial encounter: Secondary | ICD-10-CM

## 2019-11-02 DIAGNOSIS — M25561 Pain in right knee: Secondary | ICD-10-CM | POA: Insufficient documentation

## 2019-11-02 DIAGNOSIS — Z23 Encounter for immunization: Secondary | ICD-10-CM | POA: Insufficient documentation

## 2019-11-02 DIAGNOSIS — R519 Headache, unspecified: Secondary | ICD-10-CM | POA: Insufficient documentation

## 2019-11-02 DIAGNOSIS — S060XAA Concussion with loss of consciousness status unknown, initial encounter: Secondary | ICD-10-CM

## 2019-11-02 DIAGNOSIS — S0990XA Unspecified injury of head, initial encounter: Secondary | ICD-10-CM | POA: Diagnosis present

## 2019-11-02 DIAGNOSIS — R110 Nausea: Secondary | ICD-10-CM | POA: Diagnosis not present

## 2019-11-02 DIAGNOSIS — Y9301 Activity, walking, marching and hiking: Secondary | ICD-10-CM | POA: Diagnosis not present

## 2019-11-02 DIAGNOSIS — R739 Hyperglycemia, unspecified: Secondary | ICD-10-CM | POA: Diagnosis not present

## 2019-11-02 DIAGNOSIS — Z6841 Body Mass Index (BMI) 40.0 and over, adult: Secondary | ICD-10-CM | POA: Insufficient documentation

## 2019-11-02 DIAGNOSIS — Z20822 Contact with and (suspected) exposure to covid-19: Secondary | ICD-10-CM | POA: Insufficient documentation

## 2019-11-02 LAB — I-STAT CHEM 8, ED
BUN: 17 mg/dL (ref 8–23)
Calcium, Ion: 1.08 mmol/L — ABNORMAL LOW (ref 1.15–1.40)
Chloride: 108 mmol/L (ref 98–111)
Creatinine, Ser: 0.7 mg/dL (ref 0.44–1.00)
Glucose, Bld: 142 mg/dL — ABNORMAL HIGH (ref 70–99)
HCT: 35 % — ABNORMAL LOW (ref 36.0–46.0)
Hemoglobin: 11.9 g/dL — ABNORMAL LOW (ref 12.0–15.0)
Potassium: 4.2 mmol/L (ref 3.5–5.1)
Sodium: 142 mmol/L (ref 135–145)
TCO2: 24 mmol/L (ref 22–32)

## 2019-11-02 LAB — COMPREHENSIVE METABOLIC PANEL
ALT: 18 U/L (ref 0–44)
AST: 16 U/L (ref 15–41)
Albumin: 3.8 g/dL (ref 3.5–5.0)
Alkaline Phosphatase: 78 U/L (ref 38–126)
Anion gap: 11 (ref 5–15)
BUN: 15 mg/dL (ref 8–23)
CO2: 24 mmol/L (ref 22–32)
Calcium: 9 mg/dL (ref 8.9–10.3)
Chloride: 107 mmol/L (ref 98–111)
Creatinine, Ser: 0.81 mg/dL (ref 0.44–1.00)
GFR, Estimated: >60 mL/min (ref >60)
Glucose, Bld: 148 mg/dL — ABNORMAL HIGH (ref 70–99)
Potassium: 4.2 mmol/L (ref 3.5–5.1)
Sodium: 142 mmol/L (ref 135–145)
Total Bilirubin: 0.8 mg/dL (ref 0.3–1.2)
Total Protein: 6.5 g/dL (ref 6.5–8.1)

## 2019-11-02 LAB — CBC
HCT: 40 % (ref 36.0–46.0)
Hemoglobin: 12.7 g/dL (ref 12.0–15.0)
MCH: 29.1 pg (ref 26.0–34.0)
MCHC: 31.8 g/dL (ref 30.0–36.0)
MCV: 91.5 fL (ref 80.0–100.0)
Platelets: 264 10*3/uL (ref 150–400)
RBC: 4.37 MIL/uL (ref 3.87–5.11)
RDW: 12.4 % (ref 11.5–15.5)
WBC: 8.1 10*3/uL (ref 4.0–10.5)
nRBC: 0 % (ref 0.0–0.2)

## 2019-11-02 LAB — PROTIME-INR
INR: 1.2 (ref 0.8–1.2)
Prothrombin Time: 14.4 seconds (ref 11.4–15.2)

## 2019-11-02 LAB — SAMPLE TO BLOOD BANK

## 2019-11-02 LAB — LACTIC ACID, PLASMA: Lactic Acid, Venous: 1.6 mmol/L (ref 0.5–1.9)

## 2019-11-02 LAB — ETHANOL: Alcohol, Ethyl (B): <10 mg/dL (ref <10)

## 2019-11-02 MED ORDER — ONDANSETRON HCL 4 MG/2ML IJ SOLN
4.0000 mg | Freq: Once | INTRAMUSCULAR | Status: AC
  Administered 2019-11-02: 4 mg via INTRAVENOUS
  Filled 2019-11-02: qty 2

## 2019-11-02 MED ORDER — ONDANSETRON HCL 4 MG/2ML IJ SOLN
INTRAMUSCULAR | Status: AC
  Administered 2019-11-02: 4 mg
  Filled 2019-11-02: qty 2

## 2019-11-02 MED ORDER — TETANUS-DIPHTH-ACELL PERTUSSIS 5-2.5-18.5 LF-MCG/0.5 IM SUSP
0.5000 mL | Freq: Once | INTRAMUSCULAR | Status: AC
  Administered 2019-11-02: 0.5 mL via INTRAMUSCULAR
  Filled 2019-11-02: qty 0.5

## 2019-11-02 MED ORDER — HYDROCODONE-ACETAMINOPHEN 5-325 MG PO TABS
1.0000 | ORAL_TABLET | Freq: Once | ORAL | Status: AC
  Administered 2019-11-02: 1 via ORAL
  Filled 2019-11-02: qty 1

## 2019-11-02 NOTE — ED Notes (Signed)
The pt just had abdomnal surfgedry for a hernia repair 2 weeks ago

## 2019-11-02 NOTE — Progress Notes (Signed)
Orthopedic Tech Progress Note Patient Details:  Patricia Beltran 01/18/1877 [id number] Level 2 Trauma  Patient ID: Patricia Beltran, female   DOB: 01/18/1877, 64 y.o.   MRN: [id number]   Smitty Pluck 11/02/2019, 8:12 PM
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
## 2019-11-02 NOTE — ED Notes (Signed)
The pt has returned from c-t  She is now c/o pain in her rt knee no visible swelling and her c-collar  She reports is digging into the back of her head  Husband at  The bedside
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
## 2019-11-02 NOTE — ED Notes (Signed)
Patient transported to CT 

## 2019-11-02 NOTE — Progress Notes (Signed)
   11/02/19 2010  Clinical Encounter Type  Visited With Patient not available  Visit Type Trauma  Referral From Nurse  Consult/Referral To Chaplain  Chaplain in ED, spoke with Unit Secretary. Medical team with patient. No family present to visit. Informed unit secretary to call if patient or family need spiritual support.This note was prepared by Deneen Harts, M.Div..  For questions please contact by phone [phone].

## 2019-11-03 ENCOUNTER — Observation Stay (HOSPITAL_COMMUNITY): Payer: Medicare Other

## 2019-11-03 ENCOUNTER — Encounter (HOSPITAL_COMMUNITY): Payer: Self-pay | Admitting: Family Medicine

## 2019-11-03 DIAGNOSIS — S060X9A Concussion with loss of consciousness of unspecified duration, initial encounter: Secondary | ICD-10-CM

## 2019-11-03 DIAGNOSIS — R42 Dizziness and giddiness: Secondary | ICD-10-CM

## 2019-11-03 DIAGNOSIS — S0191XA Laceration without foreign body of unspecified part of head, initial encounter: Secondary | ICD-10-CM

## 2019-11-03 DIAGNOSIS — S060X0A Concussion without loss of consciousness, initial encounter: Secondary | ICD-10-CM

## 2019-11-03 DIAGNOSIS — S060XAA Concussion with loss of consciousness status unknown, initial encounter: Secondary | ICD-10-CM

## 2019-11-03 LAB — BASIC METABOLIC PANEL
Anion gap: 7 (ref 5–15)
BUN: 12 mg/dL (ref 8–23)
CO2: 26 mmol/L (ref 22–32)
Calcium: 8.8 mg/dL — ABNORMAL LOW (ref 8.9–10.3)
Chloride: 107 mmol/L (ref 98–111)
Creatinine, Ser: 0.72 mg/dL (ref 0.44–1.00)
GFR, Estimated: >60 mL/min (ref >60)
Glucose, Bld: 121 mg/dL — ABNORMAL HIGH (ref 70–99)
Potassium: 4.1 mmol/L (ref 3.5–5.1)
Sodium: 140 mmol/L (ref 135–145)

## 2019-11-03 LAB — HIV ANTIBODY (ROUTINE TESTING W REFLEX): HIV Screen 4th Generation wRfx: NONREACTIVE

## 2019-11-03 LAB — RESPIRATORY PANEL BY RT PCR (FLU A&B, COVID)
Influenza A by PCR: NEGATIVE
Influenza B by PCR: NEGATIVE
SARS Coronavirus 2 by RT PCR: NEGATIVE

## 2019-11-03 LAB — URINALYSIS, ROUTINE W REFLEX MICROSCOPIC
Bacteria, UA: NONE SEEN
Bilirubin Urine: NEGATIVE
Glucose, UA: NEGATIVE mg/dL
Ketones, ur: NEGATIVE mg/dL
Leukocytes,Ua: NEGATIVE
Nitrite: NEGATIVE
Protein, ur: NEGATIVE mg/dL
Specific Gravity, Urine: 1.019 (ref 1.005–1.030)
pH: 6 (ref 5.0–8.0)

## 2019-11-03 MED ORDER — ONDANSETRON HCL 4 MG/2ML IJ SOLN
4.0000 mg | Freq: Four times a day (QID) | INTRAMUSCULAR | Status: DC | PRN
  Administered 2019-11-03: 4 mg via INTRAVENOUS
  Filled 2019-11-03: qty 2

## 2019-11-03 MED ORDER — ACETAMINOPHEN 500 MG PO TABS
1000.0000 mg | ORAL_TABLET | Freq: Three times a day (TID) | ORAL | Status: DC
  Administered 2019-11-03 – 2019-11-04 (×4): 1000 mg via ORAL
  Filled 2019-11-03 (×4): qty 2

## 2019-11-03 MED ORDER — ACETAMINOPHEN 325 MG PO TABS: 650.0000 mg | ORAL_TABLET | Freq: Four times a day (QID) | ORAL | Status: DC | PRN

## 2019-11-03 MED ORDER — ACETAMINOPHEN 650 MG RE SUPP: 650.0000 mg | Freq: Four times a day (QID) | RECTAL | Status: DC | PRN

## 2019-11-03 MED ORDER — ONDANSETRON HCL 4 MG PO TABS: 4.0000 mg | ORAL_TABLET | Freq: Four times a day (QID) | ORAL | Status: DC | PRN

## 2019-11-03 MED ORDER — OXYCODONE HCL 5 MG PO TABS: 5.0000 mg | ORAL_TABLET | Freq: Four times a day (QID) | ORAL | Status: DC | PRN

## 2019-11-03 NOTE — ED Notes (Signed)
Pt to floor from MRI.

## 2019-11-03 NOTE — Discharge Instructions (Addendum)
Concussion, Adult  A concussion is a brain injury from a hard, direct hit (trauma) to your head or body. This direct hit causes the brain to quickly shake back and forth inside the skull. A concussion may also be called a mild traumatic brain injury (TBI). Healing from this injury can take time. What are the causes? This condition is caused by:  A direct hit to your head, such as: ? Running into a player during a game. ? Being hit in a fight. ? Hitting your head on a hard surface.  A quick and sudden movement (jolt) of the head or neck, such as in a car crash. What are the signs or symptoms? The signs of a concussion can be hard to notice. They may be missed by you, family members, and doctors. You may look fine on the outside but may not act or feel normal. Physical symptoms  Headaches.  Being tired (fatigued).  Being dizzy.  Problems with body balance.  Problems seeing or hearing.  Being sensitive to light or noise.  Feeling sick to your stomach (nausea) or throwing up (vomiting).  Not sleeping or eating as you used to.  Loss of feeling (numbness) or tingling in the body.  Seizure. Mental and emotional symptoms  Problems remembering things.  Trouble focusing your mind (concentrating), organizing, or making decisions.  Being slow to think, act, react, speak, or read.  Feeling grouchy (irritable).  Having mood changes.  Feeling worried or nervous (anxious).  Feeling sad (depressed). How is this treated? This condition may be treated by:  Stopping sports or activity if you are injured. If you hit your head or have signs of concussion: ? Do not return to sports or activities the same day. ? Get checked by a doctor before you return to your activities.  Resting your body and your mind.  Being watched carefully, often at home.  Medicines to help with symptoms such as: ? Feeling sick to your stomach. ? Headaches. ? Problems with sleep.  Avoid taking strong  pain medicines (opioids) for a concussion.  Avoiding alcohol and drugs.  Being asked to go to a concussion clinic or a place to help you recover (rehabilitation center). Recovery from a concussion can take time. Return to activities only:  When you are fully healed.  When your doctor says it is safe. Follow these instructions at home: Activity  Limit activities that need a lot of thought or focus, such as: ? Homework or work for your job. ? Watching TV. ? Using the computer or phone. ? Playing memory games and puzzles.  Rest. Rest helps your brain heal. Make sure you: ? Get plenty of sleep. Most adults should get 7-9 hours of sleep each night. ? Rest during the day. Take naps or breaks when you feel tired.  Avoid activity like exercise until your doctor says its safe. Stop any activity that makes symptoms worse.  Do not do activities that could cause a second concussion, such as riding a bike or playing sports.  Ask your doctor when you can return to your normal activities, such as school, work, sports, and driving. Your ability to react may be slower. Do not do these activities if you are dizzy. General instructions   Take over-the-counter and prescription medicines only as told by your doctor.  Do not drink alcohol until your doctor says you can.  Watch your symptoms and tell other people to do the same. Other problems can occur after a concussion. Older   adults have a higher risk of serious problems.  Tell your work manager, teachers, school nurse, school counselor, coach, or athletic trainer about your injury and symptoms. Tell them about what you can or cannot do.  Keep all follow-up visits as told by your doctor. This is important. How is this prevented?  It is very important that you do not get another brain injury. In rare cases, another injury can cause brain damage that will not go away, brain swelling, or death. The risk of this is greatest in the first 7-10 days  after a head injury. To avoid injuries: ? Stop activities that could lead to a second concussion, such as contact sports, until your doctor says it is okay. ? When you return to sports or activities:  Do not crash into other players. This is how most concussions happen.  Follow the rules.  Respect other players. ? Get regular exercise. Do strength and balance training. ? Wear a helmet that fits you well during sports, biking, or other activities.  Helmets can help protect you from serious skull and brain injuries, but they do not protect you from a concussion. Even when wearing a helmet, you should avoid being hit in the head. Contact a doctor if:  Your symptoms get worse or they do not get better.  You have new symptoms.  You have another injury. Get help right away if:  You have bad headaches or your headaches get worse.  You feel weak or numb in any part of your body.  You are mixed up (confused).  Your balance gets worse.  You keep throwing up.  You feel more sleepy than normal.  Your speech is not clear (is slurred).  You cannot recognize people or places.  You have a seizure.  Others have trouble waking you up.  You have behavior changes.  You have changes in how you see (vision).  You pass out (lose consciousness). Summary  A concussion is a brain injury from a hard, direct hit (trauma) to your head or body.  This condition is treated with rest and careful watching of symptoms.  If you keep having symptoms, call your doctor. This information is not intended to replace advice given to you by your health care provider. Make sure you discuss any questions you have with your health care provider. Document Revised: 08/25/2017 Document Reviewed: 08/25/2017 [employer] Patient Education  2020 [employer]  
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
## 2019-11-03 NOTE — ED Notes (Signed)
Pt ambulated between bed and wheel chair. Pt tolerated activity well with assistance.
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
## 2019-11-04 DIAGNOSIS — R42 Dizziness and giddiness: Secondary | ICD-10-CM | POA: Diagnosis not present

## 2019-11-04 DIAGNOSIS — S0101XA Laceration without foreign body of scalp, initial encounter: Secondary | ICD-10-CM

## 2019-11-04 DIAGNOSIS — R110 Nausea: Secondary | ICD-10-CM

## 2019-11-04 DIAGNOSIS — S060X0A Concussion without loss of consciousness, initial encounter: Secondary | ICD-10-CM | POA: Diagnosis not present

## 2019-11-04 LAB — LIPID PANEL
Cholesterol: 161 mg/dL (ref 0–200)
HDL: 44 mg/dL (ref >40)
LDL Cholesterol: 90 mg/dL (ref 0–99)
Total CHOL/HDL Ratio: 3.7 RATIO
Triglycerides: 135 mg/dL (ref <150)
VLDL: 27 mg/dL (ref 0–40)

## 2019-11-04 LAB — CK: Total CK: 94 U/L (ref 38–234)

## 2019-11-04 MED ORDER — ONDANSETRON HCL 4 MG PO TABS: 4.0000 mg | ORAL_TABLET | Freq: Every day | ORAL | 1 refills | Status: AC | PRN

## 2019-11-04 MED ORDER — ACETAMINOPHEN 500 MG PO TABS: 500.0000 mg | ORAL_TABLET | Freq: Three times a day (TID) | ORAL | 0 refills | Status: AC
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
## 2019-11-04 NOTE — Progress Notes (Signed)
Discharge teaching complete. Meds, diet, activity, follow up appointments reviewed and all questions answered. Copy of instructions given to patient. Patient discharged home via wheelchair with daughter.  

## 2019-11-05 LAB — HEMOGLOBIN A1C
Hgb A1c MFr Bld: 6.4 % — ABNORMAL HIGH (ref 4.8–5.6)
Mean Plasma Glucose: 137 mg/dL

## 2020-01-30 DIAGNOSIS — J069 Acute upper respiratory infection, unspecified: Secondary | ICD-10-CM | POA: Diagnosis not present

## 2020-01-30 DIAGNOSIS — Z0000 Encounter for general adult medical examination without abnormal findings: Secondary | ICD-10-CM | POA: Diagnosis not present

## 2020-01-30 DIAGNOSIS — E785 Hyperlipidemia, unspecified: Secondary | ICD-10-CM | POA: Diagnosis not present

## 2020-01-30 DIAGNOSIS — E1169 Type 2 diabetes mellitus with other specified complication: Secondary | ICD-10-CM | POA: Diagnosis not present

## 2020-01-30 DIAGNOSIS — K219 Gastro-esophageal reflux disease without esophagitis: Secondary | ICD-10-CM | POA: Diagnosis not present

## 2020-03-21 DIAGNOSIS — E1169 Type 2 diabetes mellitus with other specified complication: Secondary | ICD-10-CM | POA: Diagnosis not present

## 2020-03-21 DIAGNOSIS — E785 Hyperlipidemia, unspecified: Secondary | ICD-10-CM | POA: Diagnosis not present

## 2020-04-18 DIAGNOSIS — S060X9S Concussion with loss of consciousness of unspecified duration, sequela: Secondary | ICD-10-CM | POA: Diagnosis not present

## 2020-04-18 DIAGNOSIS — R42 Dizziness and giddiness: Secondary | ICD-10-CM | POA: Diagnosis not present

## 2020-06-26 DIAGNOSIS — E1169 Type 2 diabetes mellitus with other specified complication: Secondary | ICD-10-CM | POA: Diagnosis not present

## 2020-06-26 DIAGNOSIS — E785 Hyperlipidemia, unspecified: Secondary | ICD-10-CM | POA: Diagnosis not present

## 2020-08-05 DIAGNOSIS — H8112 Benign paroxysmal vertigo, left ear: Secondary | ICD-10-CM | POA: Diagnosis not present

## 2020-09-22 DIAGNOSIS — H524 Presbyopia: Secondary | ICD-10-CM | POA: Diagnosis not present

## 2020-10-01 ENCOUNTER — Other Ambulatory Visit: Payer: Self-pay | Admitting: Family Medicine

## 2020-10-01 DIAGNOSIS — Z1231 Encounter for screening mammogram for malignant neoplasm of breast: Secondary | ICD-10-CM

## 2020-11-07 ENCOUNTER — Ambulatory Visit
Admission: RE | Admit: 2020-11-07 | Discharge: 2020-11-07 | Disposition: A | Discharge status: Home / Self Care | Payer: Medicare Other | Source: Ambulatory Visit | Attending: Family Medicine | Admitting: Family Medicine

## 2020-11-07 ENCOUNTER — Other Ambulatory Visit: Payer: Self-pay

## 2020-11-07 DIAGNOSIS — Z1231 Encounter for screening mammogram for malignant neoplasm of breast: Secondary | ICD-10-CM

## 2020-12-09 DIAGNOSIS — I868 Varicose veins of other specified sites: Secondary | ICD-10-CM | POA: Diagnosis not present

## 2020-12-09 DIAGNOSIS — H0589 Other disorders of orbit: Secondary | ICD-10-CM | POA: Diagnosis not present

## 2020-12-09 DIAGNOSIS — H11823 Conjunctivochalasis, bilateral: Secondary | ICD-10-CM | POA: Diagnosis not present

## 2021-02-05 DIAGNOSIS — K219 Gastro-esophageal reflux disease without esophagitis: Secondary | ICD-10-CM | POA: Diagnosis not present

## 2021-02-05 DIAGNOSIS — Z1159 Encounter for screening for other viral diseases: Secondary | ICD-10-CM | POA: Diagnosis not present

## 2021-02-05 DIAGNOSIS — E785 Hyperlipidemia, unspecified: Secondary | ICD-10-CM | POA: Diagnosis not present

## 2021-02-05 DIAGNOSIS — Z1389 Encounter for screening for other disorder: Secondary | ICD-10-CM | POA: Diagnosis not present

## 2021-02-05 DIAGNOSIS — Z23 Encounter for immunization: Secondary | ICD-10-CM | POA: Diagnosis not present

## 2021-02-05 DIAGNOSIS — Z0000 Encounter for general adult medical examination without abnormal findings: Secondary | ICD-10-CM | POA: Diagnosis not present

## 2021-02-05 DIAGNOSIS — E1169 Type 2 diabetes mellitus with other specified complication: Secondary | ICD-10-CM | POA: Diagnosis not present

## 2021-04-17 DIAGNOSIS — R7303 Prediabetes: Secondary | ICD-10-CM | POA: Diagnosis not present

## 2021-04-20 ENCOUNTER — Ambulatory Visit: Payer: Medicare Other | Admitting: Podiatrist

## 2021-04-20 ENCOUNTER — Encounter: Payer: Self-pay | Admitting: Podiatrist

## 2021-04-20 ENCOUNTER — Ambulatory Visit (INDEPENDENT_AMBULATORY_CARE_PROVIDER_SITE_OTHER): Payer: Medicare Other

## 2021-04-20 DIAGNOSIS — M7732 Calcaneal spur, left foot: Secondary | ICD-10-CM

## 2021-04-20 DIAGNOSIS — M779 Enthesopathy, unspecified: Secondary | ICD-10-CM

## 2021-04-20 DIAGNOSIS — M7752 Other enthesopathy of left foot: Secondary | ICD-10-CM | POA: Diagnosis not present

## 2021-04-20 NOTE — Patient Instructions (Signed)
Achilles Tendinitis Rehab ?Ask your health care provider which exercises are safe for you. Do exercises exactly as told by your health care provider and adjust them as directed. It is normal to feel mild stretching, pulling, tightness, or discomfort as you do these exercises. Stop right away if you feel sudden pain or your pain gets worse. Do not begin these exercises until told by your health care provider. ?Stretching and range-of-motion exercises ?These exercises warm up your muscles and joints and improve the movement and flexibility of your ankle. These exercises also help to relieve pain. ?Standing wall calf stretch with straight knee ? ?Stand with your hands against a wall. ?Extend your left / right leg behind you, and bend your front knee slightly. Keep both of your heels on the floor. ?Point the toes of your back foot slightly inward. ?Keeping your heels on the floor and your back knee straight, shift your weight toward the wall. Do not allow your back to arch. You should feel a gentle stretch in your upper calf. ?Hold this position for __________ seconds. ?Repeat __________ times. Complete this exercise __________ times a day. ?Standing wall calf stretch with bent knee ?Stand with your hands against a wall. ?Extend your left / right leg behind you, and bend your front knee slightly. Keep both of your heels on the floor. ?Point the toes of your back foot slightly inward. ?Keeping your heels on the floor, bend your back knee slightly. You should feel a gentle stretch deep in your lower calf near your heel. ?Hold this position for __________ seconds. ?Repeat __________ times. Complete this exercise __________ times a day. ?Strengthening exercises ?These exercises build strength and control of your ankle. Endurance is the ability to use your muscles for a long time, even after they get tired. ?Plantar flexion with band ?In this exercise, you push your toes downward, away from you, with an exercise band  providing resistance. ?Sit on the floor with your left / right leg extended. You may put a pillow under your calf to give your foot more room to move. ?Loop a rubber exercise band or tube around the ball of your left / right foot. The ball of your foot is on the walking surface, right under your toes. The band or tube should be slightly tense when your foot is relaxed. If the band or tube slips, you can put on your shoe or put a washcloth between the band and your foot to help it stay in place. ?Slowly point your toes downward, pushing them away from you (plantar flexion). ?Hold this position for __________ seconds. ?Slowly release the tension in the band or tube, controlling smoothly until your foot is back to the starting position. ?Repeat steps 1-5 with your left / right leg. ?Repeat __________ times. Complete this exercise __________ times a day. ?Eccentric heel drop ?In this exercise, you stand and slowly raise your heel and then slowly lower it. This exercise lengthens the calf muscles (eccentric) while the heel bears weight. ?If this exercise is too easy, try doing it while wearing a backpack with weights in it. ?Stand on a step with the balls of your feet. The ball of your foot is on the walking surface, right under your toes. ?Do not put your heels on the step. ?For balance, rest your hands on the wall or on a railing. ?Rise up onto the balls of your feet. ?Keeping your heels up, shift all of your weight to your left / right leg and   pick up your other leg. ?Slowly lower your left / right leg so your heel drops below the level of the step. ?Put down your other foot before returning to the start position. If told by your health care provider, build up to: ?3 sets of 15 repetitions while keeping your knees straight. ?3 sets of 15 repetitions while keeping your knees slightly bent as far as told by your health care provider. ?Repeat __________ times. Complete this exercise __________ times a day. ?Balance  exercises ?These exercises improve or maintain your balance. Balance is important in preventing falls. ?Single leg stand ?If this exercise is too easy, you can try it with your eyes closed or while standing on a pillow. ?Without shoes, stand near a railing or in a door frame. Hold on to the railing or door frame as needed. ?Stand on your left / right foot. Keep your big toe down on the floor and try to keep your arch lifted. ?Hold this position for __________ seconds. ?Repeat __________ times. Complete this exercise __________ times a day. ?This information is not intended to replace advice given to you by your health care provider. Make sure you discuss any questions you have with your health care provider. ?Document Revised: 02/26/2020 Document Reviewed: 02/26/2020 ?[employer] Patient Education ? 2022 [employer] ? ?
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
## 2021-05-22 DIAGNOSIS — R7303 Prediabetes: Secondary | ICD-10-CM | POA: Diagnosis not present

## 2021-08-06 DIAGNOSIS — E1169 Type 2 diabetes mellitus with other specified complication: Secondary | ICD-10-CM | POA: Diagnosis not present

## 2021-08-06 DIAGNOSIS — E785 Hyperlipidemia, unspecified: Secondary | ICD-10-CM | POA: Diagnosis not present

## 2021-08-06 DIAGNOSIS — K5900 Constipation, unspecified: Secondary | ICD-10-CM | POA: Diagnosis not present

## 2021-10-09 ENCOUNTER — Other Ambulatory Visit: Payer: Self-pay | Admitting: Family Medicine

## 2021-10-09 DIAGNOSIS — Z1231 Encounter for screening mammogram for malignant neoplasm of breast: Secondary | ICD-10-CM

## 2021-11-13 ENCOUNTER — Ambulatory Visit
Admission: RE | Admit: 2021-11-13 | Discharge: 2021-11-13 | Disposition: A | Discharge status: Home / Self Care | Payer: Medicare Other | Source: Ambulatory Visit | Attending: Family Medicine | Admitting: Family Medicine

## 2021-11-13 DIAGNOSIS — Z1231 Encounter for screening mammogram for malignant neoplasm of breast: Secondary | ICD-10-CM

## 2022-02-22 IMAGING — MG MM DIGITAL SCREENING BILAT W/ TOMO AND CAD
8 series · 8 of 24 positions shown · non-contrast
Comparison: Previous exam(s).

CLINICAL DATA: Screening.

EXAM:
DIGITAL SCREENING BILATERAL MAMMOGRAM WITH TOMOSYNTHESIS AND CAD
TECHNIQUE: Bilateral screening digital craniocaudal and mediolateral oblique
mammograms were obtained. Bilateral screening digital breast
tomosynthesis was performed. The images were evaluated with
computer-aided detection.

[L MLO synth-2D]
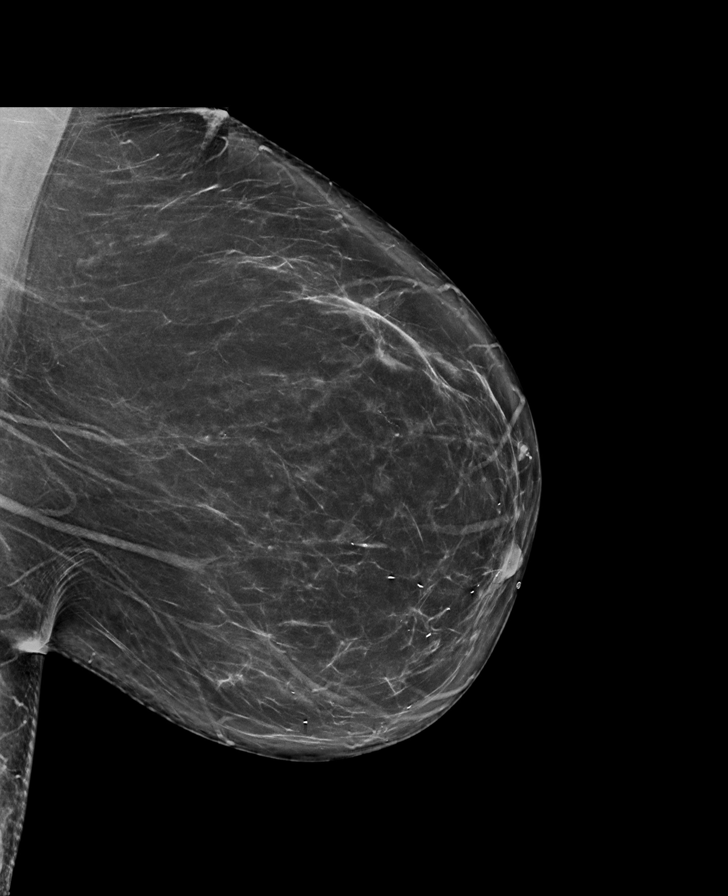

[L CC synth-2D]
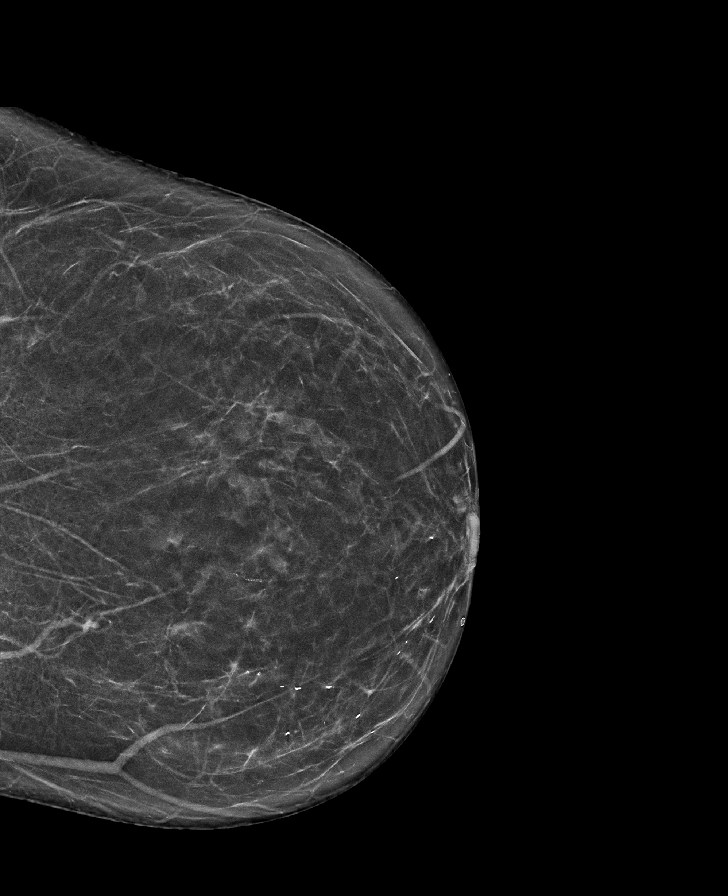

[R MLO synth-2D]
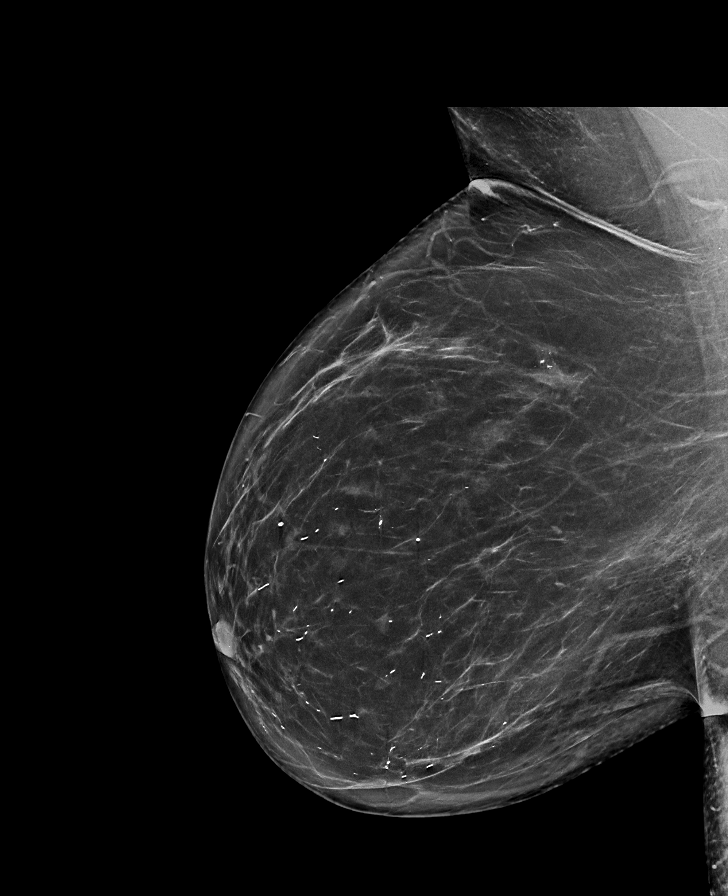

[R CC synth-2D]
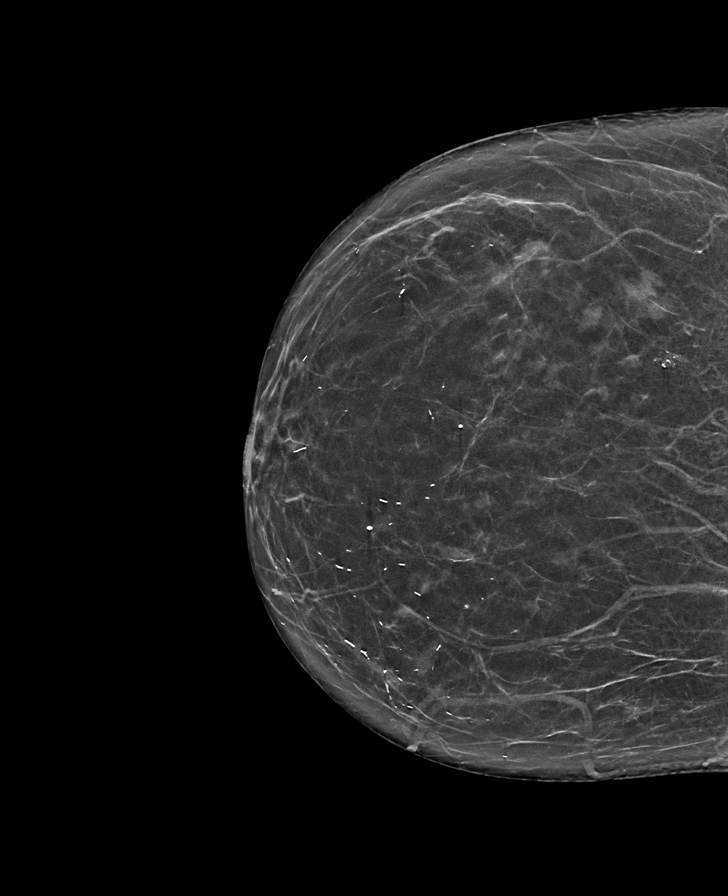

[L CC tomo · tomo slice 33/64.0]
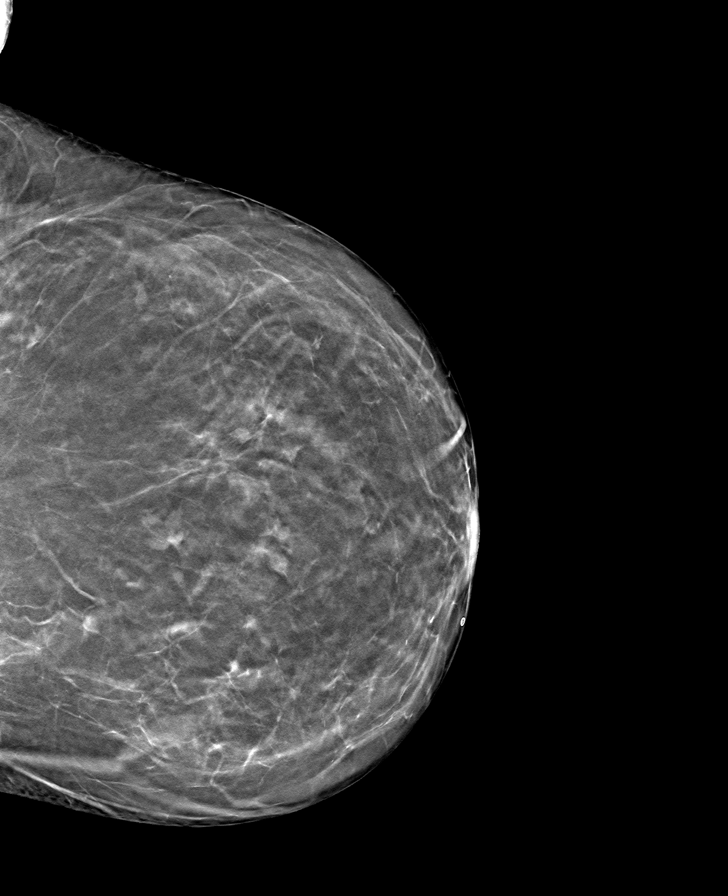

[R MLO tomo · tomo slice 43/84.0]
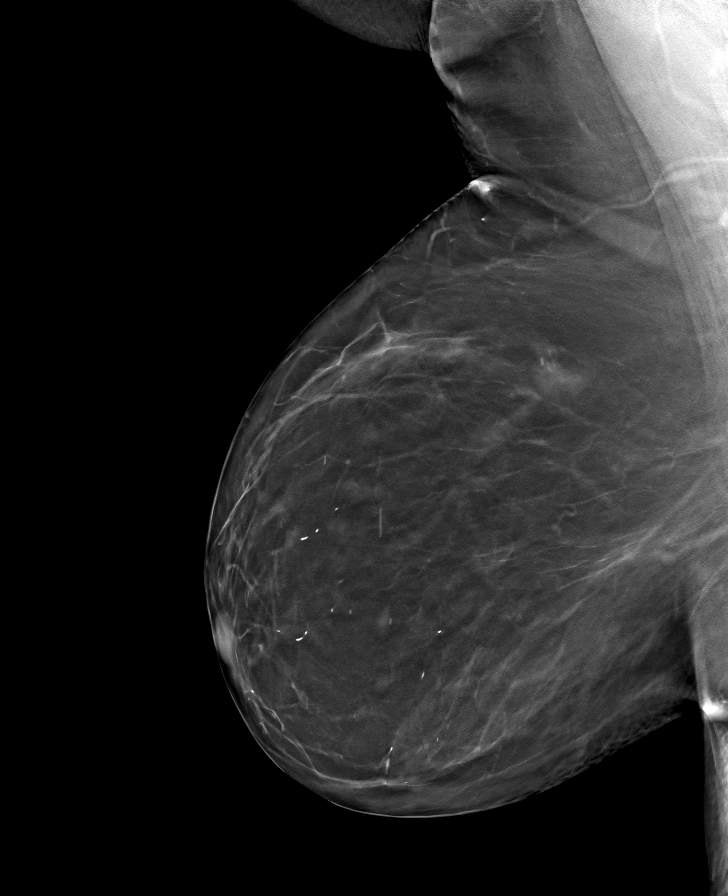

[R CC tomo · tomo slice 33/66.0]
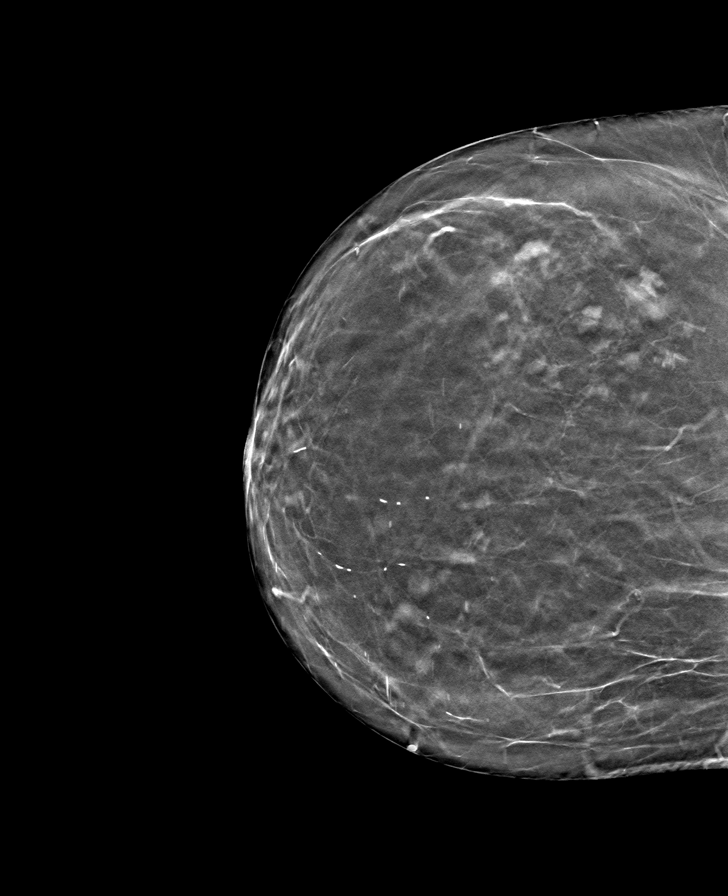

[L MLO tomo · tomo slice 41/80.0]
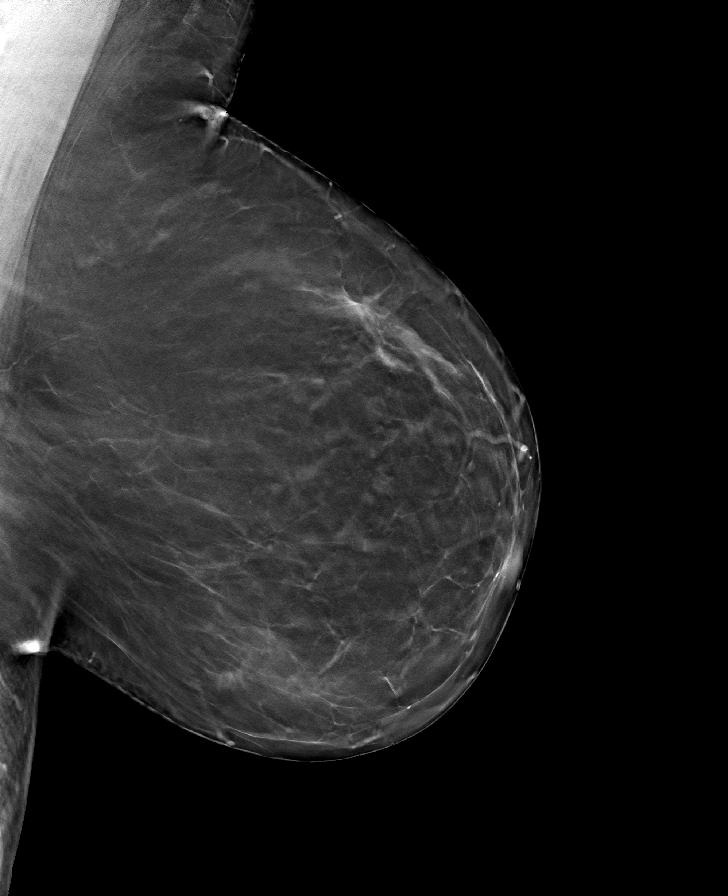

[8 of 24 positions shown; findings below may reference images not displayed]

ACR Breast Density Category b: There are scattered areas of
fibroglandular density.
FINDINGS: There are no findings suspicious for malignancy.
IMPRESSION: No mammographic evidence of malignancy. A result letter of this
screening mammogram will be mailed directly to the patient.

RECOMMENDATION:
Screening mammogram in one year. (Code:51-O-LD2)

BI-RADS CATEGORY  1: Negative.
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
## 2022-03-29 DIAGNOSIS — E785 Hyperlipidemia, unspecified: Secondary | ICD-10-CM | POA: Diagnosis not present

## 2022-03-29 DIAGNOSIS — E1169 Type 2 diabetes mellitus with other specified complication: Secondary | ICD-10-CM | POA: Diagnosis not present

## 2022-03-29 DIAGNOSIS — E119 Type 2 diabetes mellitus without complications: Secondary | ICD-10-CM | POA: Diagnosis not present

## 2022-03-29 DIAGNOSIS — Z0000 Encounter for general adult medical examination without abnormal findings: Secondary | ICD-10-CM | POA: Diagnosis not present

## 2022-09-29 DIAGNOSIS — E119 Type 2 diabetes mellitus without complications: Secondary | ICD-10-CM | POA: Diagnosis not present

## 2022-09-29 DIAGNOSIS — E785 Hyperlipidemia, unspecified: Secondary | ICD-10-CM | POA: Diagnosis not present

## 2022-09-29 DIAGNOSIS — Z23 Encounter for immunization: Secondary | ICD-10-CM | POA: Diagnosis not present

## 2022-10-06 ENCOUNTER — Other Ambulatory Visit: Payer: Self-pay | Admitting: Family Medicine

## 2022-10-06 DIAGNOSIS — Z1231 Encounter for screening mammogram for malignant neoplasm of breast: Secondary | ICD-10-CM

## 2022-10-08 ENCOUNTER — Other Ambulatory Visit: Payer: Self-pay | Admitting: Family Medicine

## 2022-10-08 DIAGNOSIS — E2839 Other primary ovarian failure: Secondary | ICD-10-CM

## 2022-10-09 DIAGNOSIS — E119 Type 2 diabetes mellitus without complications: Secondary | ICD-10-CM | POA: Diagnosis not present

## 2022-11-15 ENCOUNTER — Ambulatory Visit
Admission: RE | Admit: 2022-11-15 | Discharge: 2022-11-15 | Disposition: A | Discharge status: Home / Self Care | Payer: Medicare Other | Source: Ambulatory Visit | Attending: Family Medicine | Admitting: Family Medicine

## 2022-11-15 DIAGNOSIS — Z1231 Encounter for screening mammogram for malignant neoplasm of breast: Secondary | ICD-10-CM | POA: Diagnosis not present

## 2023-01-03 DIAGNOSIS — E1169 Type 2 diabetes mellitus with other specified complication: Secondary | ICD-10-CM | POA: Diagnosis not present

## 2023-04-06 DIAGNOSIS — E785 Hyperlipidemia, unspecified: Secondary | ICD-10-CM | POA: Diagnosis not present

## 2023-04-06 DIAGNOSIS — J309 Allergic rhinitis, unspecified: Secondary | ICD-10-CM | POA: Diagnosis not present

## 2023-04-06 DIAGNOSIS — Z0000 Encounter for general adult medical examination without abnormal findings: Secondary | ICD-10-CM | POA: Diagnosis not present

## 2023-04-06 DIAGNOSIS — M25561 Pain in right knee: Secondary | ICD-10-CM | POA: Diagnosis not present

## 2023-04-06 DIAGNOSIS — E1169 Type 2 diabetes mellitus with other specified complication: Secondary | ICD-10-CM | POA: Diagnosis not present

## 2023-04-06 DIAGNOSIS — K219 Gastro-esophageal reflux disease without esophagitis: Secondary | ICD-10-CM | POA: Diagnosis not present

## 2023-04-27 ENCOUNTER — Ambulatory Visit
Admission: RE | Admit: 2023-04-27 | Discharge: 2023-04-27 | Disposition: A | Discharge status: Home / Self Care | Payer: Medicare Other | Source: Ambulatory Visit | Attending: Family Medicine | Admitting: Family Medicine

## 2023-04-27 DIAGNOSIS — M810 Age-related osteoporosis without current pathological fracture: Secondary | ICD-10-CM | POA: Diagnosis not present

## 2023-04-27 DIAGNOSIS — E2839 Other primary ovarian failure: Secondary | ICD-10-CM

## 2023-04-28 DIAGNOSIS — M25551 Pain in right hip: Secondary | ICD-10-CM | POA: Diagnosis not present

## 2023-04-28 DIAGNOSIS — M170 Bilateral primary osteoarthritis of knee: Secondary | ICD-10-CM | POA: Diagnosis not present

## 2023-04-28 DIAGNOSIS — M1611 Unilateral primary osteoarthritis, right hip: Secondary | ICD-10-CM | POA: Diagnosis not present

## 2023-04-30 DIAGNOSIS — M25551 Pain in right hip: Secondary | ICD-10-CM | POA: Diagnosis not present

## 2023-06-27 DIAGNOSIS — M1611 Unilateral primary osteoarthritis, right hip: Secondary | ICD-10-CM | POA: Diagnosis not present

## 2023-07-11 DIAGNOSIS — E1169 Type 2 diabetes mellitus with other specified complication: Secondary | ICD-10-CM | POA: Diagnosis not present

## 2023-07-18 DIAGNOSIS — E785 Hyperlipidemia, unspecified: Secondary | ICD-10-CM | POA: Diagnosis not present

## 2023-07-18 DIAGNOSIS — E1169 Type 2 diabetes mellitus with other specified complication: Secondary | ICD-10-CM | POA: Diagnosis not present

## 2023-07-18 DIAGNOSIS — E119 Type 2 diabetes mellitus without complications: Secondary | ICD-10-CM | POA: Diagnosis not present

## 2023-08-18 DIAGNOSIS — E119 Type 2 diabetes mellitus without complications: Secondary | ICD-10-CM | POA: Diagnosis not present

## 2023-08-18 DIAGNOSIS — E1169 Type 2 diabetes mellitus with other specified complication: Secondary | ICD-10-CM | POA: Diagnosis not present

## 2023-08-18 DIAGNOSIS — E785 Hyperlipidemia, unspecified: Secondary | ICD-10-CM | POA: Diagnosis not present

## 2023-09-18 DIAGNOSIS — E1169 Type 2 diabetes mellitus with other specified complication: Secondary | ICD-10-CM | POA: Diagnosis not present

## 2023-09-18 DIAGNOSIS — E119 Type 2 diabetes mellitus without complications: Secondary | ICD-10-CM | POA: Diagnosis not present

## 2023-09-18 DIAGNOSIS — E785 Hyperlipidemia, unspecified: Secondary | ICD-10-CM | POA: Diagnosis not present

## 2023-10-11 DIAGNOSIS — E1169 Type 2 diabetes mellitus with other specified complication: Secondary | ICD-10-CM | POA: Diagnosis not present

## 2023-10-11 DIAGNOSIS — M129 Arthropathy, unspecified: Secondary | ICD-10-CM | POA: Diagnosis not present

## 2023-10-11 DIAGNOSIS — Z1211 Encounter for screening for malignant neoplasm of colon: Secondary | ICD-10-CM | POA: Diagnosis not present

## 2023-10-11 DIAGNOSIS — E785 Hyperlipidemia, unspecified: Secondary | ICD-10-CM | POA: Diagnosis not present

## 2023-10-11 DIAGNOSIS — M85852 Other specified disorders of bone density and structure, left thigh: Secondary | ICD-10-CM | POA: Diagnosis not present

## 2023-10-11 DIAGNOSIS — Z23 Encounter for immunization: Secondary | ICD-10-CM | POA: Diagnosis not present

## 2023-10-18 ENCOUNTER — Other Ambulatory Visit: Payer: Self-pay | Admitting: Family Medicine

## 2023-10-18 DIAGNOSIS — E1169 Type 2 diabetes mellitus with other specified complication: Secondary | ICD-10-CM | POA: Diagnosis not present

## 2023-10-18 DIAGNOSIS — E119 Type 2 diabetes mellitus without complications: Secondary | ICD-10-CM | POA: Diagnosis not present

## 2023-10-18 DIAGNOSIS — Z1231 Encounter for screening mammogram for malignant neoplasm of breast: Secondary | ICD-10-CM

## 2023-10-18 DIAGNOSIS — E785 Hyperlipidemia, unspecified: Secondary | ICD-10-CM | POA: Diagnosis not present

## 2023-10-24 DIAGNOSIS — Z1211 Encounter for screening for malignant neoplasm of colon: Secondary | ICD-10-CM | POA: Diagnosis not present

## 2023-11-16 ENCOUNTER — Ambulatory Visit
Admission: RE | Admit: 2023-11-16 | Discharge: 2023-11-16 | Disposition: A | Source: Ambulatory Visit | Attending: Family Medicine | Admitting: Family Medicine

## 2023-11-16 DIAGNOSIS — Z1231 Encounter for screening mammogram for malignant neoplasm of breast: Secondary | ICD-10-CM
